# Patient Record
Sex: Female | Born: 1945 | Race: White | Hispanic: No | Smoking: Never smoker
Health system: Southern US, Community
[De-identification: ages and names within clinical notes are randomized; demographics above are authoritative.]

## PROBLEM LIST (undated history)

## (undated) DIAGNOSIS — G510 Bell's palsy: Secondary | ICD-10-CM

## (undated) DIAGNOSIS — E079 Disorder of thyroid, unspecified: Secondary | ICD-10-CM

## (undated) DIAGNOSIS — C4490 Unspecified malignant neoplasm of skin, unspecified: Secondary | ICD-10-CM

## (undated) DIAGNOSIS — I10 Essential (primary) hypertension: Secondary | ICD-10-CM

---

## 2012-02-09 DIAGNOSIS — E785 Hyperlipidemia, unspecified: Secondary | ICD-10-CM | POA: Diagnosis not present

## 2012-02-09 DIAGNOSIS — IMO0002 Reserved for concepts with insufficient information to code with codable children: Secondary | ICD-10-CM | POA: Diagnosis not present

## 2012-02-09 DIAGNOSIS — M810 Age-related osteoporosis without current pathological fracture: Secondary | ICD-10-CM | POA: Diagnosis not present

## 2012-02-09 DIAGNOSIS — I10 Essential (primary) hypertension: Secondary | ICD-10-CM | POA: Diagnosis not present

## 2012-02-09 DIAGNOSIS — E781 Pure hyperglyceridemia: Secondary | ICD-10-CM | POA: Diagnosis not present

## 2012-02-16 DIAGNOSIS — E781 Pure hyperglyceridemia: Secondary | ICD-10-CM | POA: Diagnosis not present

## 2012-02-16 DIAGNOSIS — E785 Hyperlipidemia, unspecified: Secondary | ICD-10-CM | POA: Diagnosis not present

## 2012-02-16 DIAGNOSIS — I10 Essential (primary) hypertension: Secondary | ICD-10-CM | POA: Diagnosis not present

## 2012-02-16 DIAGNOSIS — M810 Age-related osteoporosis without current pathological fracture: Secondary | ICD-10-CM | POA: Diagnosis not present

## 2012-07-31 DIAGNOSIS — H52229 Regular astigmatism, unspecified eye: Secondary | ICD-10-CM | POA: Diagnosis not present

## 2012-07-31 DIAGNOSIS — H04129 Dry eye syndrome of unspecified lacrimal gland: Secondary | ICD-10-CM | POA: Diagnosis not present

## 2012-07-31 DIAGNOSIS — H524 Presbyopia: Secondary | ICD-10-CM | POA: Diagnosis not present

## 2012-07-31 DIAGNOSIS — H5200 Hypermetropia, unspecified eye: Secondary | ICD-10-CM | POA: Diagnosis not present

## 2012-12-31 DIAGNOSIS — IMO0002 Reserved for concepts with insufficient information to code with codable children: Secondary | ICD-10-CM | POA: Diagnosis not present

## 2012-12-31 DIAGNOSIS — I10 Essential (primary) hypertension: Secondary | ICD-10-CM | POA: Diagnosis not present

## 2012-12-31 DIAGNOSIS — E785 Hyperlipidemia, unspecified: Secondary | ICD-10-CM | POA: Diagnosis not present

## 2013-12-25 DIAGNOSIS — I10 Essential (primary) hypertension: Secondary | ICD-10-CM | POA: Diagnosis not present

## 2013-12-25 DIAGNOSIS — E785 Hyperlipidemia, unspecified: Secondary | ICD-10-CM | POA: Diagnosis not present

## 2013-12-25 DIAGNOSIS — IMO0002 Reserved for concepts with insufficient information to code with codable children: Secondary | ICD-10-CM | POA: Diagnosis not present

## 2014-12-01 DIAGNOSIS — Z6820 Body mass index (BMI) 20.0-20.9, adult: Secondary | ICD-10-CM | POA: Diagnosis not present

## 2014-12-01 DIAGNOSIS — I10 Essential (primary) hypertension: Secondary | ICD-10-CM | POA: Diagnosis not present

## 2014-12-01 DIAGNOSIS — E782 Mixed hyperlipidemia: Secondary | ICD-10-CM | POA: Diagnosis not present

## 2014-12-01 DIAGNOSIS — M810 Age-related osteoporosis without current pathological fracture: Secondary | ICD-10-CM | POA: Diagnosis not present

## 2015-07-07 DIAGNOSIS — H52223 Regular astigmatism, bilateral: Secondary | ICD-10-CM | POA: Diagnosis not present

## 2015-07-07 DIAGNOSIS — H02401 Unspecified ptosis of right eyelid: Secondary | ICD-10-CM | POA: Diagnosis not present

## 2015-07-07 DIAGNOSIS — H524 Presbyopia: Secondary | ICD-10-CM | POA: Diagnosis not present

## 2015-07-21 DIAGNOSIS — E782 Mixed hyperlipidemia: Secondary | ICD-10-CM | POA: Diagnosis not present

## 2015-07-21 DIAGNOSIS — I10 Essential (primary) hypertension: Secondary | ICD-10-CM | POA: Diagnosis not present

## 2015-07-21 DIAGNOSIS — Z1389 Encounter for screening for other disorder: Secondary | ICD-10-CM | POA: Diagnosis not present

## 2015-07-21 DIAGNOSIS — Z6820 Body mass index (BMI) 20.0-20.9, adult: Secondary | ICD-10-CM | POA: Diagnosis not present

## 2016-04-12 DIAGNOSIS — Z1389 Encounter for screening for other disorder: Secondary | ICD-10-CM | POA: Diagnosis not present

## 2016-04-12 DIAGNOSIS — I10 Essential (primary) hypertension: Secondary | ICD-10-CM | POA: Diagnosis not present

## 2016-04-12 DIAGNOSIS — E782 Mixed hyperlipidemia: Secondary | ICD-10-CM | POA: Diagnosis not present

## 2016-04-12 DIAGNOSIS — Z6820 Body mass index (BMI) 20.0-20.9, adult: Secondary | ICD-10-CM | POA: Diagnosis not present

## 2016-11-17 DIAGNOSIS — Z6820 Body mass index (BMI) 20.0-20.9, adult: Secondary | ICD-10-CM | POA: Diagnosis not present

## 2016-11-17 DIAGNOSIS — I10 Essential (primary) hypertension: Secondary | ICD-10-CM | POA: Diagnosis not present

## 2016-11-17 DIAGNOSIS — E782 Mixed hyperlipidemia: Secondary | ICD-10-CM | POA: Diagnosis not present

## 2016-11-17 DIAGNOSIS — M810 Age-related osteoporosis without current pathological fracture: Secondary | ICD-10-CM | POA: Diagnosis not present

## 2017-05-06 DIAGNOSIS — Z1211 Encounter for screening for malignant neoplasm of colon: Secondary | ICD-10-CM | POA: Diagnosis not present

## 2017-09-19 DIAGNOSIS — Z6820 Body mass index (BMI) 20.0-20.9, adult: Secondary | ICD-10-CM | POA: Diagnosis not present

## 2017-09-19 DIAGNOSIS — Z1389 Encounter for screening for other disorder: Secondary | ICD-10-CM | POA: Diagnosis not present

## 2017-09-19 DIAGNOSIS — I10 Essential (primary) hypertension: Secondary | ICD-10-CM | POA: Diagnosis not present

## 2017-09-19 DIAGNOSIS — E782 Mixed hyperlipidemia: Secondary | ICD-10-CM | POA: Diagnosis not present

## 2017-11-13 DIAGNOSIS — I10 Essential (primary) hypertension: Secondary | ICD-10-CM | POA: Diagnosis not present

## 2017-11-13 DIAGNOSIS — D696 Thrombocytopenia, unspecified: Secondary | ICD-10-CM | POA: Diagnosis not present

## 2017-11-13 DIAGNOSIS — E039 Hypothyroidism, unspecified: Secondary | ICD-10-CM | POA: Diagnosis not present

## 2018-06-21 DIAGNOSIS — H25813 Combined forms of age-related cataract, bilateral: Secondary | ICD-10-CM | POA: Diagnosis not present

## 2018-06-29 ENCOUNTER — Other Ambulatory Visit (HOSPITAL_COMMUNITY): Payer: Self-pay | Admitting: Family Medicine

## 2018-06-29 DIAGNOSIS — Z1389 Encounter for screening for other disorder: Secondary | ICD-10-CM | POA: Diagnosis not present

## 2018-06-29 DIAGNOSIS — H6123 Impacted cerumen, bilateral: Secondary | ICD-10-CM | POA: Diagnosis not present

## 2018-06-29 DIAGNOSIS — Z6820 Body mass index (BMI) 20.0-20.9, adult: Secondary | ICD-10-CM | POA: Diagnosis not present

## 2018-06-29 DIAGNOSIS — H532 Diplopia: Secondary | ICD-10-CM

## 2018-06-29 DIAGNOSIS — I10 Essential (primary) hypertension: Secondary | ICD-10-CM | POA: Diagnosis not present

## 2018-06-29 DIAGNOSIS — N390 Urinary tract infection, site not specified: Secondary | ICD-10-CM | POA: Diagnosis not present

## 2018-06-29 DIAGNOSIS — Z0001 Encounter for general adult medical examination with abnormal findings: Secondary | ICD-10-CM | POA: Diagnosis not present

## 2018-06-29 DIAGNOSIS — E785 Hyperlipidemia, unspecified: Secondary | ICD-10-CM | POA: Diagnosis not present

## 2018-09-06 ENCOUNTER — Encounter (HOSPITAL_COMMUNITY): Payer: Self-pay | Admitting: Radiology

## 2018-09-06 ENCOUNTER — Ambulatory Visit (HOSPITAL_COMMUNITY)
Admission: RE | Admit: 2018-09-06 | Discharge: 2018-09-06 | Disposition: A | Discharge status: Home / Self Care | Payer: Medicare Other | Source: Ambulatory Visit | Attending: Family Medicine | Admitting: Family Medicine

## 2018-09-06 DIAGNOSIS — H532 Diplopia: Secondary | ICD-10-CM

## 2019-04-19 DIAGNOSIS — N390 Urinary tract infection, site not specified: Secondary | ICD-10-CM | POA: Diagnosis not present

## 2019-04-25 DIAGNOSIS — N390 Urinary tract infection, site not specified: Secondary | ICD-10-CM | POA: Diagnosis not present

## 2019-04-25 DIAGNOSIS — R8279 Other abnormal findings on microbiological examination of urine: Secondary | ICD-10-CM | POA: Diagnosis not present

## 2019-10-03 DIAGNOSIS — X32XXXA Exposure to sunlight, initial encounter: Secondary | ICD-10-CM | POA: Diagnosis not present

## 2019-10-03 DIAGNOSIS — L821 Other seborrheic keratosis: Secondary | ICD-10-CM | POA: Diagnosis not present

## 2019-10-03 DIAGNOSIS — L570 Actinic keratosis: Secondary | ICD-10-CM | POA: Diagnosis not present

## 2019-11-23 ENCOUNTER — Ambulatory Visit
Admission: EM | Admit: 2019-11-23 | Discharge: 2019-11-23 | Disposition: A | Discharge status: Home / Self Care | Payer: Medicare Other

## 2019-11-23 ENCOUNTER — Other Ambulatory Visit: Payer: Self-pay

## 2019-11-23 DIAGNOSIS — M792 Neuralgia and neuritis, unspecified: Secondary | ICD-10-CM | POA: Diagnosis not present

## 2019-11-23 HISTORY — DX: Bell's palsy: G51.0

## 2019-11-23 HISTORY — DX: Essential (primary) hypertension: I10

## 2019-11-23 HISTORY — DX: Unspecified malignant neoplasm of skin, unspecified: C44.90

## 2019-11-23 HISTORY — DX: Disorder of thyroid, unspecified: E07.9

## 2019-11-23 MED ORDER — ACETAMINOPHEN 325 MG PO TABS: 650.0000 mg | ORAL_TABLET | Freq: Four times a day (QID) | ORAL | 0 refills | Status: AC | PRN

## 2019-11-23 NOTE — ED Triage Notes (Addendum)
Patient states that she has right side temple headache x 3 days, some eye pressure. some sinus congestion. 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

## 2019-11-23 NOTE — ED Provider Notes (Signed)
[facility]    CSN: [id number] Arrival date & time: 11/23/19  0844      History   Chief Complaint Chief Complaint  Patient presents with  . Headache    HPI Chelsea Harris is a 74 y.o. female.   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  Symptoms associated with sinus congestion and eye pressure.  Localizes pain to to her scalp at right temporal.  Has tried OTC analgesics without relief.  Symptoms resolved at this time.  Denies similar symptoms in the past.  Denies fever, chills, dysphagia, confusion, diplopia, blurry vision,  neck swelling, nausea, vomiting, chest pain, SOB.    The history is provided by the patient. No language interpreter was used.  Headache   Past Medical History:  Diagnosis Date  . Bell's palsy   . Hypertension   . Skin cancer   . Thyroid disease     There are no problems to display for this patient.   History reviewed. No pertinent surgical history.  OB History   No obstetric history on file.      Home Medications    Prior to Admission medications   Medication Sig Start Date End Date Taking? Authorizing Provider  amLODipine (NORVASC) 10 MG tablet Take 10 mg by mouth daily.   Yes [provider]  levothyroxine (SYNTHROID) 50 MCG tablet Take 50 mcg by mouth daily before breakfast.   Yes [provider]  acetaminophen (TYLENOL) 325 MG tablet Take 2 tablets (650 mg total) by mouth every 6 (six) hours as needed. Take with food 11/23/19   Emerson Monte, FNP    Family History Family History  Problem Relation Age of Onset  . Healthy Mother   . Healthy Father     Social History Social History   Tobacco Use  . Smoking status: Never Smoker  . Smokeless tobacco: Never Used  Substance Use Topics  . Alcohol use: Never  . Drug use: Not on file     Allergies   Patient has no  known allergies.   Review of Systems Review of Systems  Constitutional: Negative.   Respiratory: Negative.   Cardiovascular: Negative.   Neurological: Positive for headaches.  All other systems reviewed and are negative.    Physical Exam Triage Vital Signs ED Triage Vitals  Enc Vitals Group     BP 11/23/19 0928 (!) 163/96     Pulse Rate 11/23/19 0928 95     Resp 11/23/19 0928 17     Temp 11/23/19 0928 98 F (36.7 C)     Temp Source 11/23/19 0928 Oral     SpO2 11/23/19 0928 97 %     Weight --      Height --      Head Circumference --      Peak Flow --      Pain Score 11/23/19 0937 8     Pain Loc --      Pain Edu? --      Excl. in [place]? --    No data found.  Updated Vital Signs BP (!) 163/96 (BP Location: Right Arm)   Pulse 95   Temp 98 F (36.7 C) (Oral)   Resp 17   SpO2 97%   Visual Acuity Right Eye Distance:   Left Eye Distance:   Bilateral Distance:    Right Eye Near:  Left Eye Near:    Bilateral Near:     Physical Exam Vitals and nursing note reviewed.  Constitutional:      General: She is not in acute distress.    Appearance: She is well-developed and normal weight. She is not ill-appearing, toxic-appearing or diaphoretic.  Cardiovascular:     Rate and Rhythm: Normal rate and regular rhythm.     Pulses: Normal pulses.     Heart sounds: Normal heart sounds. No murmur. No gallop.   Pulmonary:     Effort: Pulmonary effort is normal. No respiratory distress.     Breath sounds: Normal breath sounds. No stridor. No wheezing, rhonchi or rales.  Chest:     Chest wall: No tenderness.  Neurological:     Mental Status: She is alert and oriented to person, place, and time.     Cranial Nerves: Cranial nerves are intact. No cranial nerve deficit.     Sensory: No sensory deficit.     Motor: Motor function is intact. No weakness.     Coordination: Coordination is intact. Coordination normal.     Gait: Gait is intact.      UC Treatments / Results   Labs (all labs ordered are listed, but only abnormal results are displayed) Labs Reviewed - No data to display  EKG   Radiology No results found.  Procedures Procedures (including critical care time)  Medications Ordered in UC Medications - No data to display  Initial Impression / Assessment and Plan / UC Course  I have reviewed the triage vital signs and the nursing notes.  Pertinent labs & imaging results that were available during my care of the patient were reviewed by me and considered in my medical decision making (see chart for details).    Patient is stable for discharge. Advised patient to take OTC Tylenol/ibuprofen for pain management To follow-up with primary care for possible referral to neurology Return for worsening of symptoms   Final Clinical Impressions(s) / UC Diagnoses   Final diagnoses:  Neuralgia involving scalp     Discharge Instructions     Advised patient to take OTC NSAID Tylenol/ibuprofen Follow-up with primary care for possible referral to neurology Return for worsening of symptoms    ED Prescriptions    Medication Sig Dispense Auth. Provider   acetaminophen (TYLENOL) 325 MG tablet Take 2 tablets (650 mg total) by mouth every 6 (six) hours as needed. Take with food 30 tablet Saory Carriero, Darrelyn Hillock, FNP     PDMP not reviewed this encounter.   Emerson Monte, [place] 11/23/19 1015

## 2019-11-23 NOTE — Discharge Instructions (Signed)
Advised patient to take OTC NSAID Tylenol/ibuprofen Follow-up with primary care for possible referral to neurology Return for worsening of symptoms

## 2019-11-29 DIAGNOSIS — Z6821 Body mass index (BMI) 21.0-21.9, adult: Secondary | ICD-10-CM | POA: Diagnosis not present

## 2019-11-29 DIAGNOSIS — B029 Zoster without complications: Secondary | ICD-10-CM | POA: Diagnosis not present

## 2020-01-28 DIAGNOSIS — N390 Urinary tract infection, site not specified: Secondary | ICD-10-CM | POA: Diagnosis not present

## 2020-01-29 DIAGNOSIS — R300 Dysuria: Secondary | ICD-10-CM | POA: Diagnosis not present

## 2020-02-12 DIAGNOSIS — Z23 Encounter for immunization: Secondary | ICD-10-CM | POA: Diagnosis not present

## 2020-05-20 DIAGNOSIS — U071 COVID-19: Secondary | ICD-10-CM | POA: Diagnosis not present

## 2020-06-25 DIAGNOSIS — H43393 Other vitreous opacities, bilateral: Secondary | ICD-10-CM | POA: Diagnosis not present

## 2020-07-13 DIAGNOSIS — H35363 Drusen (degenerative) of macula, bilateral: Secondary | ICD-10-CM | POA: Diagnosis not present

## 2020-07-13 DIAGNOSIS — H52213 Irregular astigmatism, bilateral: Secondary | ICD-10-CM | POA: Diagnosis not present

## 2020-07-13 DIAGNOSIS — H2511 Age-related nuclear cataract, right eye: Secondary | ICD-10-CM | POA: Diagnosis not present

## 2020-07-13 DIAGNOSIS — H2513 Age-related nuclear cataract, bilateral: Secondary | ICD-10-CM | POA: Diagnosis not present

## 2020-07-13 DIAGNOSIS — H35033 Hypertensive retinopathy, bilateral: Secondary | ICD-10-CM | POA: Diagnosis not present

## 2020-07-13 DIAGNOSIS — H25013 Cortical age-related cataract, bilateral: Secondary | ICD-10-CM | POA: Diagnosis not present

## 2020-07-28 DIAGNOSIS — H25811 Combined forms of age-related cataract, right eye: Secondary | ICD-10-CM | POA: Diagnosis not present

## 2020-07-28 DIAGNOSIS — H2511 Age-related nuclear cataract, right eye: Secondary | ICD-10-CM | POA: Diagnosis not present

## 2020-08-03 DIAGNOSIS — Z961 Presence of intraocular lens: Secondary | ICD-10-CM | POA: Diagnosis not present

## 2020-08-03 DIAGNOSIS — Z9841 Cataract extraction status, right eye: Secondary | ICD-10-CM | POA: Diagnosis not present

## 2020-08-19 DIAGNOSIS — H2512 Age-related nuclear cataract, left eye: Secondary | ICD-10-CM | POA: Diagnosis not present

## 2020-08-19 DIAGNOSIS — H25012 Cortical age-related cataract, left eye: Secondary | ICD-10-CM | POA: Diagnosis not present

## 2020-08-25 DIAGNOSIS — H25012 Cortical age-related cataract, left eye: Secondary | ICD-10-CM | POA: Diagnosis not present

## 2020-08-25 DIAGNOSIS — H2512 Age-related nuclear cataract, left eye: Secondary | ICD-10-CM | POA: Diagnosis not present

## 2020-08-25 DIAGNOSIS — H25812 Combined forms of age-related cataract, left eye: Secondary | ICD-10-CM | POA: Diagnosis not present

## 2020-09-17 NOTE — Progress Notes (Addendum)
[facility] - Clinic Note  09/25/2020     CHIEF COMPLAINT Patient presents for Retina Evaluation   HISTORY OF PRESENT ILLNESS: Chelsea Harris is a 75 y.o. female who presents to the clinic today for:   HPI    Retina Evaluation    In left eye.  Context:  distance vision and near vision.          Comments    Pt states her vision is improved, following cataract surgery recently.  Pt does not know dates of surgery.  Right eye CEIOL was first.  Surgeon--Dr. Elmer Picker.  Pt denies eye pain or discomfort and denies any new or worsening floaters or fol OU.       Last edited by Corrinne Eagle on 09/25/2020  8:51 AM. (History)    pt is here on the referral of Dr. Earlene Plater for concern of nevus OS, pt states Dr. Elmer Picker did cataract sx and noticed the nevus, she states Dr. Earlene Plater read Dr. Deirdre Evener notes and wanted to refer her out for the nevus, pt is unsure when her cataract sx were, but says she just finished the post op drops  Referring physician: Desiree Lucy, OD [address] [place],  [place] [address]  HISTORICAL INFORMATION:   Selected notes from the MEDICAL RECORD NUMBER Referred by Dr. Desiree Lucy for concern of nevus LEE:  Ocular Hx- PMH-    CURRENT MEDICATIONS: No current outpatient medications on file. (Ophthalmic Drugs)   No current facility-administered medications for this visit. (Ophthalmic Drugs)   Current Outpatient Medications (Other)  Medication Sig  . acetaminophen (TYLENOL) 325 MG tablet Take 2 tablets (650 mg total) by mouth every 6 (six) hours as needed. Take with food  . amLODipine (NORVASC) 10 MG tablet Take 10 mg by mouth daily.  Marland Kitchen levothyroxine (SYNTHROID) 50 MCG tablet Take 50 mcg by mouth daily before breakfast.   No current facility-administered medications for this visit. (Other)      REVIEW OF SYSTEMS: ROS    Positive for: Eyes   Negative for: Constitutional, Gastrointestinal, Neurological, Skin, Genitourinary,  Musculoskeletal, HENT, Endocrine, Cardiovascular, Respiratory, Psychiatric, Allergic/Imm, Heme/Lymph   Last edited by Corrinne Eagle on 09/25/2020  8:37 AM. (History)       ALLERGIES No Known Allergies  PAST MEDICAL HISTORY Past Medical History:  Diagnosis Date  . Bell's palsy   . Hypertension   . Skin cancer   . Thyroid disease    History reviewed. No pertinent surgical history.  FAMILY HISTORY Family History  Problem Relation Age of Onset  . Healthy Mother   . Healthy Father     SOCIAL HISTORY Social History   Tobacco Use  . Smoking status: Never Smoker  . Smokeless tobacco: Never Used  Substance Use Topics  . Alcohol use: Never         OPHTHALMIC EXAM:  Base Eye Exam    Visual Acuity (Snellen - Linear)      Right Left   Dist [place] 20/30 -2 20/30 -2   Dist ph [place] 20/20 -2 20/25 +2       Tonometry (Tonopen, 8:49 AM)      Right Left   Pressure 12 13       Pupils      Dark Light Shape React APD   Right 3 2 Round Brisk 0   Left 3 2 Round Brisk 0       Visual Fields  Left Right    Full Full       Extraocular Movement      Right Left    Full Full       Neuro/Psych    Oriented x3: Yes   Mood/Affect: Normal       Dilation    Both eyes: 1.0% Mydriacyl, 2.5% Phenylephrine @ 8:49 AM        Slit Lamp and Fundus Exam    Slit Lamp Exam      Right Left   Lids/Lashes Dermatochalasis - upper lid Dermatochalasis - upper lid   Conjunctiva/Sclera White and quiet White and quiet   Cornea 1-2+ inferior Punctate epithelial erosions, well healed cataract wounds Trace inferior Punctate epithelial erosions, well healed cataract wounds, mild arcus   Anterior Chamber Deep and quiet Deep and quiet   Iris Round and dilated Round and dilated   Lens PC IOL in good position PC IOL in good position   Vitreous Vitreous syneresis Vitreous syneresis       Fundus Exam      Right Left   Disc Pink and Sharp, mild tilt, temporal PPA Pink and Sharp, mild tilt,  mild temporal PPA, Compact   C/D Ratio 0.5 0.3   Macula Flat, Blunted foveal reflex, fine Drusen temporal macula, Pigment clumping, No heme or edema Flat, Blunted foveal reflex, fine Drusen temporal macula, Pigment clumping, No heme or edema   Vessels mild attenuation, mild tortuousity, AV crossing changes mild attenuation, mild tortuousity, AV crossing changes, mild dilation of ST venule   Periphery Attached, no heme    Attached, pigmented choroidal nevus at 0100 with overlying drusen, no orange pigment or SRF approx 3x4-5DD -- less than 63mm in thickness, No heme         Refraction    Manifest Refraction      Sphere Cylinder Axis Dist VA   Right -1.50 +0.75 180 20/20-1   Left -1.75 +1.75 175 20/20-2          IMAGING AND PROCEDURES  Imaging and Procedures for 09/25/2020  OCT, Retina - OU - Both Eyes       Right Eye Quality was good. Central Foveal Thickness: 261. Progression has no prior data. Findings include normal foveal contour, no IRF, no SRF.   Left Eye Quality was good. Central Foveal Thickness: 263. Progression has no prior data. Findings include normal foveal contour, no IRF, no SRF (Focal elevated hyper reflective choroidal lesion superior periphery caught on widefield).   Notes *Images captured and stored on drive  Diagnosis / Impression:  NFP, no IRF/SRF OU OS: Focal elevated hyper reflective choroidal lesion superior periphery caught on widefield -- choroidal nevus  Clinical management:  See below  Abbreviations: NFP - Normal foveal profile. CME - cystoid macular edema. PED - pigment epithelial detachment. IRF - intraretinal fluid. SRF - subretinal fluid. EZ - ellipsoid zone. ERM - epiretinal membrane. ORA - outer retinal atrophy. ORT - outer retinal tubulation. SRHM - subretinal hyper-reflective material. IRHM - intraretinal hyper-reflective material        Color Fundus Photography Optos - OU - Both Eyes       Right Eye Progression has no prior data.  Disc findings include (tilted). Macula : flat, drusen. Vessels : tortuous vessels. Periphery : RPE abnormality.   Left Eye Progression has no prior data. Disc findings include normal observations. Macula : drusen. Vessels : tortuous vessels (Focal dilation on ST venule). Periphery : RPE abnormality (Focal choroidal nevus at 0130 -- +  drusen, no SRF or orange pigment).   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 Retinal edema  H35.81 OCT, Retina - OU - Both Eyes  3. Essential hypertension  I10   4. Hypertensive retinopathy of both eyes  H35.033   5. Pseudophakia, both eyes  Z96.1     1. Choroidal Nevus OS  - mildly elevated pigmented lesion at 0100  - no visual symptoms, SRF or orange pigment  - +drusen  - thickness < 58mm  - OCT and Optos color images obtained today (01.14.21) to establish baseline  - discussed findings, prognosis  - recommend monitoring  - f/u 4 mos, sooner prn -- DFE/OCT, Optos color images  2. No retinal edema on exam or OCT  3,4. Hypertensive retinopathy OU - discussed importance of tight BP control - monitor  5. Pseudophakia OU  - s/p CE/IOL OU (Fall/Winter 2021 w/ Dr. Herbert Deaner)  - IOLs in good position, doing well - monitor  Ophthalmic Meds Ordered this visit:  No orders of the defined types were placed in this encounter.     Return in about 4 months (around 01/23/2021) for choroidal nevus OS -- Dilated Exam, OCT, Optos color.  There are no Patient Instructions on file for this visit.   Explained the diagnoses, plan, and follow up with the patient and they expressed understanding.  Patient expressed understanding of the importance of proper follow up care.   This document serves as a record of services personally performed  by Gardiner Sleeper, MD, PhD. It was created on their behalf by San Jetty. Owens Shark, OA an ophthalmic technician. The creation of this record is the provider's dictation and/or activities during the visit.    Electronically signed by: San Jetty. Marguerita Merles 01.06.2022 12:43 PM   Gardiner Sleeper, M.D., Ph.D. Diseases & Surgery of the Retina and Vitreous [facility]  I have reviewed the above documentation for accuracy and completeness, and I agree with the above. Gardiner Sleeper, M.D., Ph.D. 09/25/20 12:43 PM  Abbreviations: M myopia (nearsighted); A astigmatism; H hyperopia (farsighted); P presbyopia; Mrx spectacle prescription;  CTL contact lenses; OD right eye; OS left eye; OU both eyes  XT exotropia; ET esotropia; PEK punctate epithelial keratitis; PEE punctate epithelial erosions; DES dry eye syndrome; MGD meibomian gland dysfunction; ATs artificial tears; PFAT's preservative free artificial tears; [place] nuclear sclerotic cataract; PSC posterior subcapsular cataract; ERM epi-retinal membrane; PVD posterior vitreous detachment; RD retinal detachment; DM diabetes mellitus; DR diabetic retinopathy; NPDR non-proliferative diabetic retinopathy; PDR proliferative diabetic retinopathy; CSME clinically significant macular edema; DME diabetic macular edema; dbh dot blot hemorrhages; CWS cotton wool spot; POAG primary open angle glaucoma; C/D cup-to-disc ratio; HVF humphrey visual field; GVF goldmann visual field; OCT optical coherence tomography; IOP intraocular pressure; BRVO Branch retinal vein occlusion; CRVO central retinal vein occlusion; CRAO central retinal artery occlusion; BRAO branch retinal artery occlusion; RT retinal tear; SB scleral buckle; PPV pars plana vitrectomy; VH Vitreous hemorrhage; PRP panretinal laser photocoagulation; IVK intravitreal kenalog; VMT vitreomacular traction; MH Macular hole;  NVD neovascularization of the disc; NVE neovascularization elsewhere; AREDS age  related eye disease study; ARMD age related macular degeneration;  POAG primary open angle glaucoma; EBMD epithelial/anterior basement membrane dystrophy; ACIOL anterior chamber intraocular lens; IOL intraocular lens; PCIOL posterior chamber intraocular lens; Phaco/IOL phacoemulsification with intraocular lens placement; [place] photorefractive keratectomy; LASIK laser assisted in situ keratomileusis; HTN hypertension; DM diabetes mellitus; COPD chronic obstructive pulmonary disease

## 2020-09-25 ENCOUNTER — Ambulatory Visit (INDEPENDENT_AMBULATORY_CARE_PROVIDER_SITE_OTHER): Payer: Medicare Other | Admitting: Ophthalmology

## 2020-09-25 ENCOUNTER — Encounter (INDEPENDENT_AMBULATORY_CARE_PROVIDER_SITE_OTHER): Payer: Self-pay | Admitting: Ophthalmology

## 2020-09-25 ENCOUNTER — Other Ambulatory Visit: Payer: Self-pay

## 2020-09-25 DIAGNOSIS — I10 Essential (primary) hypertension: Secondary | ICD-10-CM

## 2020-09-25 DIAGNOSIS — H35033 Hypertensive retinopathy, bilateral: Secondary | ICD-10-CM | POA: Diagnosis not present

## 2020-09-25 DIAGNOSIS — D3132 Benign neoplasm of left choroid: Secondary | ICD-10-CM | POA: Diagnosis not present

## 2020-09-25 DIAGNOSIS — Z961 Presence of intraocular lens: Secondary | ICD-10-CM

## 2020-09-25 DIAGNOSIS — H3581 Retinal edema: Secondary | ICD-10-CM | POA: Diagnosis not present

## 2020-10-09 DIAGNOSIS — R32 Unspecified urinary incontinence: Secondary | ICD-10-CM | POA: Diagnosis not present

## 2020-10-09 DIAGNOSIS — M810 Age-related osteoporosis without current pathological fracture: Secondary | ICD-10-CM | POA: Diagnosis not present

## 2020-10-09 DIAGNOSIS — Z1331 Encounter for screening for depression: Secondary | ICD-10-CM | POA: Diagnosis not present

## 2020-10-09 DIAGNOSIS — Z0000 Encounter for general adult medical examination without abnormal findings: Secondary | ICD-10-CM | POA: Diagnosis not present

## 2020-10-09 DIAGNOSIS — Z6820 Body mass index (BMI) 20.0-20.9, adult: Secondary | ICD-10-CM | POA: Diagnosis not present

## 2020-10-09 DIAGNOSIS — I10 Essential (primary) hypertension: Secondary | ICD-10-CM | POA: Diagnosis not present

## 2020-10-09 DIAGNOSIS — E7849 Other hyperlipidemia: Secondary | ICD-10-CM | POA: Diagnosis not present

## 2020-10-09 DIAGNOSIS — Z1389 Encounter for screening for other disorder: Secondary | ICD-10-CM | POA: Diagnosis not present

## 2020-12-29 DIAGNOSIS — Z1231 Encounter for screening mammogram for malignant neoplasm of breast: Secondary | ICD-10-CM | POA: Diagnosis not present

## 2021-01-20 DIAGNOSIS — N6001 Solitary cyst of right breast: Secondary | ICD-10-CM | POA: Diagnosis not present

## 2021-01-20 DIAGNOSIS — N6489 Other specified disorders of breast: Secondary | ICD-10-CM | POA: Diagnosis not present

## 2021-01-20 DIAGNOSIS — R922 Inconclusive mammogram: Secondary | ICD-10-CM | POA: Diagnosis not present

## 2021-01-20 DIAGNOSIS — R928 Other abnormal and inconclusive findings on diagnostic imaging of breast: Secondary | ICD-10-CM | POA: Diagnosis not present

## 2021-01-20 DIAGNOSIS — N6320 Unspecified lump in the left breast, unspecified quadrant: Secondary | ICD-10-CM | POA: Diagnosis not present

## 2021-01-27 NOTE — Progress Notes (Signed)
[facility] Note  02/01/2021     CHIEF COMPLAINT Patient presents for Retina Follow Up   HISTORY OF PRESENT ILLNESS: Chelsea Harris is a 75 y.o. female who presents to the clinic today for:   HPI    Retina Follow Up    Patient presents with  Other.  In left eye.  This started 4 months ago.  I, the attending physician,  performed the HPI with the patient and updated documentation appropriately.          Comments    Pt here for 4 mo retinal follow up [place] Nevus OS. Pt states no vision changes, no ocular discomfort or pain.        Last edited by Bernarda Caffey, MD on 02/02/2021 12:53 PM. (History)    pt states no new vision or health concerns  Referring physician: Leticia Clas, [address] [place],  [place] [address]  HISTORICAL INFORMATION:   Selected notes from the MEDICAL RECORD NUMBER Referred by Dr. Leticia Clas for concern of nevus LEE:  Ocular Hx- PMH-    CURRENT MEDICATIONS: No current outpatient medications on file. (Ophthalmic Drugs)   No current facility-administered medications for this visit. (Ophthalmic Drugs)   Current Outpatient Medications (Other)  Medication Sig  . acetaminophen (TYLENOL) 325 MG tablet Take 2 tablets (650 mg total) by mouth every 6 (six) hours as needed. Take with food  . amLODipine (NORVASC) 10 MG tablet Take 10 mg by mouth daily.  Marland Kitchen levothyroxine (SYNTHROID) 50 MCG tablet Take 50 mcg by mouth daily before breakfast.   No current facility-administered medications for this visit. (Other)      REVIEW OF SYSTEMS: ROS    Positive for: Eyes   Negative for: Constitutional, Gastrointestinal, Neurological, Skin, Genitourinary, Musculoskeletal, HENT, Endocrine, Cardiovascular, Respiratory, Psychiatric, Allergic/Imm, Heme/Lymph   Last edited by Kingsley Spittle, COT on 02/01/2021  9:08 AM. (History)       ALLERGIES No Known Allergies  PAST MEDICAL HISTORY Past Medical History:   Diagnosis Date  . Bell's palsy   . Hypertension   . Skin cancer   . Thyroid disease    History reviewed. No pertinent surgical history.  FAMILY HISTORY Family History  Problem Relation Age of Onset  . Healthy Mother   . Healthy Father     SOCIAL HISTORY Social History   Tobacco Use  . Smoking status: Never Smoker  . Smokeless tobacco: Never Used  Substance Use Topics  . Alcohol use: Never         OPHTHALMIC EXAM:  Base Eye Exam    Visual Acuity (Snellen - Linear)      Right Left   Dist [place] 20/40 20/30 +1   Dist ph [place] 20/25 +2 20/20 -1       Tonometry (Tonopen, 9:14 AM)      Right Left   Pressure 10 12       Pupils      Dark Light Shape React APD   Right 3 2 Round Brisk None   Left 3 2 Round Brisk None       Visual Fields (Counting fingers)      Left Right    Full Full       Extraocular Movement      Right Left    Full, Ortho Full, Ortho       Neuro/Psych    Oriented x3: Yes   Mood/Affect: Normal  Dilation    Both eyes: 1.0% Mydriacyl, 2.5% Phenylephrine @ 9:15 AM        Slit Lamp and Fundus Exam    Slit Lamp Exam      Right Left   Lids/Lashes Dermatochalasis - upper lid Dermatochalasis - upper lid   Conjunctiva/Sclera White and quiet White and quiet   Cornea 1-2+ inferior Punctate epithelial erosions, well healed cataract wounds Trace inferior Punctate epithelial erosions, well healed cataract wounds, mild arcus   Anterior Chamber Deep and quiet Deep and quiet   Iris Round and dilated Round and dilated   Lens PC IOL in good position PC IOL in good position   Vitreous Vitreous syneresis Vitreous syneresis       Fundus Exam      Right Left   Disc Pink and Sharp, mild tilt, temporal PPA Pink and Sharp, mild tilt, mild temporal PPA, Compact   C/D Ratio 0.5 0.3   Macula Flat, Blunted foveal reflex, fine Drusen temporal macula, Pigment clumping, No heme or edema Flat, Blunted foveal reflex, fine Drusen temporal macula, Pigment  clumping, No heme or edema   Vessels mild attenuation, mild tortuousity, AV crossing changes mild attenuation, mild tortuousity, AV crossing changes, mild dilation of ST venule   Periphery Attached, no heme    Attached, pigmented choroidal nevus at 0100 with overlying drusen, no orange pigment or SRF approx 3x4-5DD -- less than 66mm in thickness, No heme           IMAGING AND PROCEDURES  Imaging and Procedures for 02/01/2021  OCT, Retina - OU - Both Eyes       Right Eye Quality was good. Central Foveal Thickness: 263. Progression has been stable. Findings include normal foveal contour, no IRF, no SRF.   Left Eye Quality was good. Central Foveal Thickness: 304. Progression has been stable. Findings include normal foveal contour, no SRF, intraretinal fluid (Focal elevated hyper reflective choroidal lesion superior periphery caught on widefield, +cystic changes temporal fovea).   Notes *Images captured and stored on drive  Diagnosis / Impression:  NFP, no IRF/SRF OU OS: Focal elevated hyper reflective choroidal lesion superior periphery caught on widefield -- choroidal nevus, +cystic changes temporal fovea   Clinical management:  See below  Abbreviations: NFP - Normal foveal profile. CME - cystoid macular edema. PED - pigment epithelial detachment. IRF - intraretinal fluid. SRF - subretinal fluid. EZ - ellipsoid zone. ERM - epiretinal membrane. ORA - outer retinal atrophy. ORT - outer retinal tubulation. SRHM - subretinal hyper-reflective material. IRHM - intraretinal hyper-reflective material        Color Fundus Photography Optos - OU - Both Eyes       Right Eye Progression has no prior data. Disc findings include (tilted). Macula : flat, drusen. Vessels : tortuous vessels. Periphery : RPE abnormality.   Left Eye Progression has no prior data. Disc findings include normal observations. Macula : drusen. Vessels : tortuous vessels (Focal dilation on ST venule). Periphery : RPE  abnormality (Focal choroidal nevus at 0130 -- +drusen, no SRF or orange pigment).   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 Retinal edema  H35.81 OCT, Retina - OU -  Both Eyes  3. Essential hypertension  I10   4. Hypertensive retinopathy of both eyes  H35.033   5. Pseudophakia, both eyes  Z96.1     1. Choroidal Nevus OS  - mildly elevated pigmented lesion at 0100  - no visual symptoms, SRF or orange pigment  - +drusen  - thickness < 22mm  - OCT and Optos color images obtained (01.14.21) to establish baseline -- unchanged on today's repeat images and exam  - discussed findings, prognosis  - recommend monitoring  - f/u 9-12 mos, sooner prn -- DFE/OCT, Optos color images  2. No retinal edema on exam or OCT  3,4. Hypertensive retinopathy OU - discussed importance of tight BP control - monitor  5. Pseudophakia OU  - s/p CE/IOL OU (Fall/Winter 2021 w/ Dr. Herbert Deaner)  - IOLs in good position, doing well - monitor  Ophthalmic Meds Ordered this visit:  No orders of the defined types were placed in this encounter.     Return for f/u 9-12 months, choroidal nevus OS, DFE, OCT.  There are no Patient Instructions on file for this visit.  This document serves as a record of services personally performed by Gardiner Sleeper, MD, PhD. It was created on their behalf by Leeann Must, [place], an ophthalmic technician. The creation of this record is the provider's dictation and/or activities during the visit.    Electronically signed by: Leeann Must, COA @TODAY @ 12:57 PM   This document serves as a record of services personally performed by Gardiner Sleeper, MD, PhD. It was created on their behalf by San Jetty. Owens Shark, OA an ophthalmic technician. The creation of this  record is the provider's dictation and/or activities during the visit.    Electronically signed by: San Jetty. Marguerita Merles 05.23.2022 12:57 PM  Gardiner Sleeper, M.D., Ph.D. Diseases & Surgery of the Retina and [place] 02/01/2021   I have reviewed the above documentation for accuracy and completeness, and I agree with the above. Gardiner Sleeper, M.D., Ph.D. 02/02/21 12:57 PM   Abbreviations: M myopia (nearsighted); A astigmatism; H hyperopia (farsighted); P presbyopia; Mrx spectacle prescription;  CTL contact lenses; OD right eye; OS left eye; OU both eyes  XT exotropia; ET esotropia; PEK punctate epithelial keratitis; PEE punctate epithelial erosions; DES dry eye syndrome; MGD meibomian gland dysfunction; ATs artificial tears; PFAT's preservative free artificial tears; [place] nuclear sclerotic cataract; PSC posterior subcapsular cataract; ERM epi-retinal membrane; PVD posterior vitreous detachment; RD retinal detachment; DM diabetes mellitus; DR diabetic retinopathy; NPDR non-proliferative diabetic retinopathy; PDR proliferative diabetic retinopathy; CSME clinically significant macular edema; DME diabetic macular edema; dbh dot blot hemorrhages; CWS cotton wool spot; POAG primary open angle glaucoma; C/D cup-to-disc ratio; HVF humphrey visual field; GVF goldmann visual field; OCT optical coherence tomography; IOP intraocular pressure; BRVO Branch retinal vein occlusion; CRVO central retinal vein occlusion; CRAO central retinal artery occlusion; BRAO branch retinal artery occlusion; RT retinal tear; SB scleral buckle; PPV pars plana vitrectomy; VH Vitreous hemorrhage; PRP panretinal laser photocoagulation; IVK intravitreal kenalog; VMT vitreomacular traction; MH Macular hole;  NVD neovascularization of the disc; NVE neovascularization elsewhere; AREDS age related eye disease study; ARMD age related macular degeneration; POAG primary open angle glaucoma; EBMD  epithelial/anterior basement membrane dystrophy; ACIOL anterior chamber intraocular lens; IOL intraocular lens; PCIOL posterior chamber intraocular lens; Phaco/IOL phacoemulsification with intraocular lens placement; [place] photorefractive keratectomy; LASIK laser assisted in situ keratomileusis; HTN hypertension; DM diabetes mellitus; COPD chronic obstructive pulmonary disease

## 2021-02-01 ENCOUNTER — Ambulatory Visit (INDEPENDENT_AMBULATORY_CARE_PROVIDER_SITE_OTHER): Payer: Medicare Other | Admitting: Ophthalmology

## 2021-02-01 ENCOUNTER — Encounter (INDEPENDENT_AMBULATORY_CARE_PROVIDER_SITE_OTHER): Payer: Self-pay | Admitting: Ophthalmology

## 2021-02-01 ENCOUNTER — Other Ambulatory Visit: Payer: Self-pay

## 2021-02-01 DIAGNOSIS — H3581 Retinal edema: Secondary | ICD-10-CM

## 2021-02-01 DIAGNOSIS — I10 Essential (primary) hypertension: Secondary | ICD-10-CM

## 2021-02-01 DIAGNOSIS — Z961 Presence of intraocular lens: Secondary | ICD-10-CM

## 2021-02-01 DIAGNOSIS — H35033 Hypertensive retinopathy, bilateral: Secondary | ICD-10-CM | POA: Diagnosis not present

## 2021-02-01 DIAGNOSIS — D3132 Benign neoplasm of left choroid: Secondary | ICD-10-CM

## 2021-02-02 ENCOUNTER — Encounter (INDEPENDENT_AMBULATORY_CARE_PROVIDER_SITE_OTHER): Payer: Self-pay | Admitting: Ophthalmology

## 2021-06-17 DIAGNOSIS — X32XXXD Exposure to sunlight, subsequent encounter: Secondary | ICD-10-CM | POA: Diagnosis not present

## 2021-06-17 DIAGNOSIS — L570 Actinic keratosis: Secondary | ICD-10-CM | POA: Diagnosis not present

## 2021-06-17 DIAGNOSIS — L820 Inflamed seborrheic keratosis: Secondary | ICD-10-CM | POA: Diagnosis not present

## 2021-07-22 DIAGNOSIS — L820 Inflamed seborrheic keratosis: Secondary | ICD-10-CM | POA: Diagnosis not present

## 2021-09-30 DIAGNOSIS — M810 Age-related osteoporosis without current pathological fracture: Secondary | ICD-10-CM | POA: Diagnosis not present

## 2021-09-30 DIAGNOSIS — Z6820 Body mass index (BMI) 20.0-20.9, adult: Secondary | ICD-10-CM | POA: Diagnosis not present

## 2021-09-30 DIAGNOSIS — R32 Unspecified urinary incontinence: Secondary | ICD-10-CM | POA: Diagnosis not present

## 2021-09-30 DIAGNOSIS — E7849 Other hyperlipidemia: Secondary | ICD-10-CM | POA: Diagnosis not present

## 2021-09-30 DIAGNOSIS — I10 Essential (primary) hypertension: Secondary | ICD-10-CM | POA: Diagnosis not present

## 2021-09-30 DIAGNOSIS — E782 Mixed hyperlipidemia: Secondary | ICD-10-CM | POA: Diagnosis not present

## 2021-09-30 DIAGNOSIS — R928 Other abnormal and inconclusive findings on diagnostic imaging of breast: Secondary | ICD-10-CM | POA: Diagnosis not present

## 2021-10-04 ENCOUNTER — Other Ambulatory Visit (HOSPITAL_COMMUNITY): Payer: Self-pay | Admitting: Family Medicine

## 2021-10-04 DIAGNOSIS — R928 Other abnormal and inconclusive findings on diagnostic imaging of breast: Secondary | ICD-10-CM

## 2021-10-05 ENCOUNTER — Ambulatory Visit (HOSPITAL_COMMUNITY)
Admission: RE | Admit: 2021-10-05 | Discharge: 2021-10-05 | Disposition: A | Discharge status: Home / Self Care | Payer: Medicare Other | Source: Ambulatory Visit | Attending: Family Medicine | Admitting: Family Medicine

## 2021-10-05 ENCOUNTER — Other Ambulatory Visit: Payer: Self-pay

## 2021-10-05 DIAGNOSIS — R922 Inconclusive mammogram: Secondary | ICD-10-CM | POA: Diagnosis not present

## 2021-10-05 DIAGNOSIS — R928 Other abnormal and inconclusive findings on diagnostic imaging of breast: Secondary | ICD-10-CM

## 2022-01-21 NOTE — Progress Notes (Shared)
[facility] Note  02/01/2022     CHIEF COMPLAINT Patient presents for Retina Follow Up   HISTORY OF PRESENT ILLNESS: Chelsea Harris is a 76 y.o. female who presents to the clinic today for:   HPI     Retina Follow Up   Patient presents with  Other.  In left eye.  This started 4 months ago.  I, the attending physician,  performed the HPI with the patient and updated documentation appropriately.        Comments   Patient here for 12 months retina follow up for choroidal nevus OS. Patient states vision doing fine. Has allergies bad this year. Eyes are watery and gummed up. Scratchy. No eye pain.      Last edited by Bernarda Caffey, MD on 02/03/2022  1:12 PM.    pt states no change in vision  Referring physician: Leticia Clas, [address] [place],  [place] [address]  HISTORICAL INFORMATION:   Selected notes from the MEDICAL RECORD NUMBER Referred by Dr. Leticia Clas for concern of nevus LEE:  Ocular Hx- PMH-    CURRENT MEDICATIONS: No current outpatient medications on file. (Ophthalmic Drugs)   No current facility-administered medications for this visit. (Ophthalmic Drugs)   Current Outpatient Medications (Other)  Medication Sig   acetaminophen (TYLENOL) 325 MG tablet Take 2 tablets (650 mg total) by mouth every 6 (six) hours as needed. Take with food   amLODipine (NORVASC) 10 MG tablet Take 10 mg by mouth daily.   levothyroxine (SYNTHROID) 50 MCG tablet Take 50 mcg by mouth daily before breakfast.   No current facility-administered medications for this visit. (Other)   REVIEW OF SYSTEMS: ROS   Positive for: Eyes Negative for: Constitutional, Gastrointestinal, Neurological, Skin, Genitourinary, Musculoskeletal, HENT, Endocrine, Cardiovascular, Respiratory, Psychiatric, Allergic/Imm, Heme/Lymph Last edited by Theodore Demark, COA on 02/01/2022  9:53 AM.     ALLERGIES No Known Allergies  PAST MEDICAL HISTORY Past  Medical History:  Diagnosis Date   Bell's palsy    Hypertension    Skin cancer    Thyroid disease    History reviewed. No pertinent surgical history.  FAMILY HISTORY Family History  Problem Relation Age of Onset   Healthy Mother    Healthy Father    SOCIAL HISTORY Social History   Tobacco Use   Smoking status: Never   Smokeless tobacco: Never  Vaping Use   Vaping Use: Never used  Substance Use Topics   Alcohol use: Never       OPHTHALMIC EXAM:  Base Eye Exam     Visual Acuity (Snellen - Linear)       Right Left   Dist [place] 20/40 20/25   Dist ph [place] 20/25 -1 20/20         Tonometry (Tonopen, 9:50 AM)       Right Left   Pressure 12 10         Pupils       Dark Light Shape React APD   Right 3 2 Round Brisk None   Left 3 2 Round Brisk None         Visual Fields (Counting fingers)       Left Right    Full Full         Extraocular Movement       Right Left    Full, Ortho Full, Ortho         Neuro/Psych  Oriented x3: Yes   Mood/Affect: Normal         Dilation     Both eyes: 1.0% Mydriacyl, 2.5% Phenylephrine @ 9:50 AM           Slit Lamp and Fundus Exam     Slit Lamp Exam       Right Left   Lids/Lashes Dermatochalasis - upper lid, Meibomian gland dysfunction Dermatochalasis - upper lid, Meibomian gland dysfunction   Conjunctiva/Sclera White and quiet White and quiet   Cornea 3+ fine Punctate epithelial erosions, well healed cataract wounds, mild arcus 1-2+inferior Punctate epithelial erosions, well healed cataract wounds, mild arcus, +EBMD   Anterior Chamber Deep and quiet Deep and quiet   Iris Round and dilated Round and dilated   Lens PC IOL in good position PC IOL in good position, 1-2+ Posterior capsular opacification   Anterior Vitreous Vitreous syneresis Vitreous syneresis         Fundus Exam       Right Left   Disc Pink and Sharp, mild tilt, temporal PPA Pink and Sharp, mild tilt, mild temporal PPA, Compact    C/D Ratio 0.4 0.3   Macula Flat, Blunted foveal reflex, fine Drusen temporal macula, Pigment clumping, No heme or edema Flat, Blunted foveal reflex, fine Drusen temporal macula, Pigment clumping, No heme or edema   Vessels mild attenuation, mild tortuosity mild attenuation, mild tortuosity   Periphery Attached, no heme    Attached, pigmented choroidal nevus at 0100 with overlying drusen, no orange pigment or SRF approx 3x4-5DD -- less than 95m in thickness -- stable from prior, No heme           IMAGING AND PROCEDURES  Imaging and Procedures for 02/01/2022  OCT, Retina - OU - Both Eyes       Right Eye Quality was good. Central Foveal Thickness: 255. Progression has been stable. Findings include normal foveal contour, no IRF, no SRF.   Left Eye Quality was good. Central Foveal Thickness: 260. Progression has improved. Findings include normal foveal contour, no SRF, intraretinal fluid (Focal elevated hyper reflective choroidal lesion superior periphery caught on widefield -- stable, interval improvement in cystic changes temporal fovea - resolved).   Notes *Images captured and stored on drive  Diagnosis / Impression:  NFP, no IRF/SRF OU OS: Focal elevated hyper reflective choroidal lesion superior periphery caught on widefield -- stable, interval improvement in cystic changes temporal fovea - resolved   Clinical management:  See below  Abbreviations: NFP - Normal foveal profile. CME - cystoid macular edema. PED - pigment epithelial detachment. IRF - intraretinal fluid. SRF - subretinal fluid. EZ - ellipsoid zone. ERM - epiretinal membrane. ORA - outer retinal atrophy. ORT - outer retinal tubulation. SRHM - subretinal hyper-reflective material. IRHM - intraretinal hyper-reflective material      Color Fundus Photography Optos - OU - Both Eyes       Right Eye Progression has been stable. Disc findings include (tilted). Macula : flat, drusen. Vessels : tortuous vessels.  Periphery : RPE abnormality.   Left Eye Progression has been stable. Disc findings include normal observations. Macula : drusen. Vessels : tortuous vessels (Focal dilation on ST venule). Periphery : RPE abnormality (Focal choroidal nevus at 0130 -- +drusen, no SRF or orange pigment).   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 Essential hypertension  I10 OCT, Retina - OU - Both Eyes    3. Hypertensive retinopathy of both eyes  H35.033 OCT, Retina - OU - Both Eyes    4. Pseudophakia, both eyes  Z96.1      1. Choroidal Nevus OS  - mildly elevated pigmented lesion at 0100  - no visual symptoms, SRF or orange pigment  - +drusen  - thickness < 5m  - OCT and Optos color images obtained (01.14.21) to establish baseline -- unchanged on today's repeat images and exam  - discussed findings, prognosis  - recommend monitoring  - f/u 12 mos, sooner prn -- DFE/OCT, Optos color images  2,3. Hypertensive retinopathy OU - discussed importance of tight BP control - monitor  4. Pseudophakia OU  - s/p CE/IOL OU (Fall/Winter 2021 w/ Dr. HHerbert Deaner  - IOLs in good position, doing well - monitor  Ophthalmic Meds Ordered this visit:  No orders of the defined types were placed in this encounter.    Return in about 1 year (around 02/02/2023) for f/u choroidal nevus OS, DFE, OCT.  There are no Patient Instructions on file for this visit.  This document serves as a record of services personally performed by BGardiner Sleeper MD, PhD. It was created on their behalf by BBernarda Caffey MD, an ophthalmic technician. The creation of this record is the provider's dictation and/or activities during the visit.    Electronically signed by: BBernarda Caffey MD 01/21/22 1:16 PM   BGardiner Sleeper M.D., Ph.D. Diseases & Surgery of the Retina and Vitreous [facility] I have reviewed the above documentation for accuracy and completeness, and I agree with the above. BGardiner Sleeper M.D., Ph.D. 02/03/22 1:16 PM  Abbreviations: M myopia (nearsighted); A astigmatism; H hyperopia (farsighted); P presbyopia; Mrx spectacle prescription;  CTL contact lenses; OD right eye; OS left eye; OU both eyes  XT exotropia; ET esotropia; PEK punctate epithelial keratitis; PEE punctate epithelial erosions; DES dry eye syndrome; MGD meibomian gland dysfunction; ATs artificial tears; PFAT's preservative free artificial tears; [place] sclerotic cataract; PSC posterior subcapsular cataract; ERM epi-retinal membrane; PVD posterior vitreous detachment; RD retinal detachment; DM diabetes mellitus; DR diabetic retinopathy; NPDR non-proliferative diabetic retinopathy; PDR proliferative diabetic retinopathy; CSME clinically significant macular edema; DME diabetic macular edema; dbh dot blot hemorrhages; CWS cotton wool spot; POAG primary open angle glaucoma; C/D cup-to-disc ratio; HVF humphrey visual field; GVF goldmann visual field; OCT optical coherence tomography; IOP intraocular pressure; BRVO Branch retinal vein occlusion; CRVO central retinal vein occlusion; CRAO central retinal artery occlusion; BRAO branch retinal artery occlusion; RT retinal tear; SB scleral buckle; PPV pars plana vitrectomy; VH Vitreous hemorrhage; PRP panretinal laser photocoagulation; IVK intravitreal kenalog; VMT vitreomacular traction; MH Macular hole;  NVD neovascularization of the disc; NVE neovascularization elsewhere; AREDS age related eye disease study; ARMD age related macular degeneration; POAG primary open angle glaucoma; EBMD epithelial/anterior basement membrane dystrophy; ACIOL anterior chamber intraocular lens; IOL intraocular lens; PCIOL posterior chamber intraocular lens; Phaco/IOL phacoemulsification  with intraocular lens placement; P[place]iaphotorefractive keratectomy; LASIK laser assisted in situ keratomileusis; HTN hypertension; DM diabetes mellitus; COPD chronic obstructive pulmonary disease

## 2022-02-01 ENCOUNTER — Ambulatory Visit (INDEPENDENT_AMBULATORY_CARE_PROVIDER_SITE_OTHER): Payer: Medicare Other | Admitting: Ophthalmology

## 2022-02-01 ENCOUNTER — Encounter (INDEPENDENT_AMBULATORY_CARE_PROVIDER_SITE_OTHER): Payer: Self-pay | Admitting: Ophthalmology

## 2022-02-01 DIAGNOSIS — Z961 Presence of intraocular lens: Secondary | ICD-10-CM

## 2022-02-01 DIAGNOSIS — D3132 Benign neoplasm of left choroid: Secondary | ICD-10-CM

## 2022-02-01 DIAGNOSIS — H35033 Hypertensive retinopathy, bilateral: Secondary | ICD-10-CM

## 2022-02-01 DIAGNOSIS — I10 Essential (primary) hypertension: Secondary | ICD-10-CM | POA: Diagnosis not present

## 2022-02-03 ENCOUNTER — Encounter (INDEPENDENT_AMBULATORY_CARE_PROVIDER_SITE_OTHER): Payer: Self-pay | Admitting: Ophthalmology

## 2022-02-28 DIAGNOSIS — W57XXXA Bitten or stung by nonvenomous insect and other nonvenomous arthropods, initial encounter: Secondary | ICD-10-CM | POA: Diagnosis not present

## 2022-02-28 DIAGNOSIS — Z6820 Body mass index (BMI) 20.0-20.9, adult: Secondary | ICD-10-CM | POA: Diagnosis not present

## 2022-02-28 DIAGNOSIS — S30860A Insect bite (nonvenomous) of lower back and pelvis, initial encounter: Secondary | ICD-10-CM | POA: Diagnosis not present

## 2022-05-24 DIAGNOSIS — Z0001 Encounter for general adult medical examination with abnormal findings: Secondary | ICD-10-CM | POA: Diagnosis not present

## 2022-05-24 DIAGNOSIS — I10 Essential (primary) hypertension: Secondary | ICD-10-CM | POA: Diagnosis not present

## 2022-05-24 DIAGNOSIS — E782 Mixed hyperlipidemia: Secondary | ICD-10-CM | POA: Diagnosis not present

## 2022-05-24 DIAGNOSIS — Z1331 Encounter for screening for depression: Secondary | ICD-10-CM | POA: Diagnosis not present

## 2022-05-24 DIAGNOSIS — Z681 Body mass index (BMI) 19 or less, adult: Secondary | ICD-10-CM | POA: Diagnosis not present

## 2022-06-01 DIAGNOSIS — Z1211 Encounter for screening for malignant neoplasm of colon: Secondary | ICD-10-CM | POA: Diagnosis not present

## 2022-12-01 DIAGNOSIS — H04123 Dry eye syndrome of bilateral lacrimal glands: Secondary | ICD-10-CM | POA: Diagnosis not present

## 2023-01-05 NOTE — Progress Notes (Shared)
[facility] - Clinic Note  01/11/2023     CHIEF COMPLAINT Patient presents for Retina Follow Up   HISTORY OF PRESENT ILLNESS: Chelsea Harris is a 77 y.o. female who presents to the clinic today for:   HPI     Retina Follow Up   Patient presents with  Other.  In left eye.  Severity is moderate.  Duration of 12 months.  Since onset it is stable.  I, the attending physician,  performed the HPI with the patient and updated documentation appropriately.        Comments   12 month Retina eval. Patient states she saw Dr.Davis and was told there is a film on her implant. Unsure of which eye. Patient states vision may not be as good      Last edited by Rennis Chris, MD on 01/12/2023  1:27 PM.    Pt states that Dr. Earlene Plater told her she has a "skim" on her lens implants, she uses Systane 2-3 times a day  Referring physician: Desiree Lucy, OD [address] [place],  [place] [address]  HISTORICAL INFORMATION:   Selected notes from the MEDICAL RECORD NUMBER Referred by Dr. Desiree Lucy for concern of nevus LEE:  Ocular Hx- PMH-    CURRENT MEDICATIONS: No current outpatient medications on file. (Ophthalmic Drugs)   No current facility-administered medications for this visit. (Ophthalmic Drugs)   Current Outpatient Medications (Other)  Medication Sig   acetaminophen (TYLENOL) 325 MG tablet Take 2 tablets (650 mg total) by mouth every 6 (six) hours as needed. Take with food   amLODipine (NORVASC) 10 MG tablet Take 10 mg by mouth daily.   levothyroxine (SYNTHROID) 50 MCG tablet Take 50 mcg by mouth daily before breakfast.   No current facility-administered medications for this visit. (Other)   REVIEW OF SYSTEMS: ROS   Positive for: Endocrine, Eyes Negative for: Constitutional, Gastrointestinal, Neurological, Skin, Genitourinary, Musculoskeletal, HENT, Cardiovascular, Respiratory, Psychiatric, Allergic/Imm, Heme/Lymph Last edited by Lana Fish, COT  on 01/11/2023  9:44 AM.      ALLERGIES No Known Allergies  PAST MEDICAL HISTORY Past Medical History:  Diagnosis Date   Bell's palsy    Hypertension    Skin cancer    Thyroid disease    History reviewed. No pertinent surgical history.  FAMILY HISTORY Family History  Problem Relation Age of Onset   Healthy Mother    Healthy Father    SOCIAL HISTORY Social History   Tobacco Use   Smoking status: Never   Smokeless tobacco: Never  Vaping Use   Vaping Use: Never used  Substance Use Topics   Alcohol use: Never       OPHTHALMIC EXAM:  Base Eye Exam     Visual Acuity (Snellen - Linear)       Right Left   Dist [place] 20/40 20/25   Dist ph [place] 20/25-1 20/25         Tonometry (Tonopen, 9:46 AM)       Right Left   Pressure 13 14         Pupils       Dark Light Shape React APD   Right 3 2 Round Brisk None   Left 3 2 Round Brisk None         Visual Fields (Counting fingers)       Left Right    Full Full         Extraocular Movement  Right Left    Full, Ortho Full, Ortho         Neuro/Psych     Oriented x3: Yes   Mood/Affect: Normal         Dilation     Both eyes: 1.0% Mydriacyl, 2.5% Phenylephrine @ 9:46 AM           Slit Lamp and Fundus Exam     Slit Lamp Exam       Right Left   Lids/Lashes Dermatochalasis - upper lid, mild MGD Dermatochalasis - upper lid, mild MGD   Conjunctiva/Sclera temporal pinguecula temporal pinguecula   Cornea 2-3+ fine Punctate epithelial erosions, well healed cataract wounds, mild arcus Trace Punctate epithelial erosions, well healed cataract wounds, mild arcus, +EBMD   Anterior Chamber deep and clear deep and clear   Iris Round and dilated Round and dilated   Lens PC IOL in good position PC IOL in good position, 1+ Posterior capsular opacification   Anterior Vitreous Vitreous syneresis Vitreous syneresis         Fundus Exam       Right Left   Disc Pink and Sharp, mild tilt, temporal PPA  Pink and Sharp, mild tilt, mild temporal PPA, Compact   C/D Ratio 0.4 0.3   Macula Flat, Blunted foveal reflex, fine Drusen temporal macula, Pigment clumping, No heme or edema Flat, Blunted foveal reflex, fine Drusen temporal macula, Pigment clumping, No heme or edema   Vessels attenuated, Tortuous attenuated, Tortuous   Periphery Attached, no heme Attached, pigmented choroidal nevus at 0100 with overlying drusen, no orange pigment or SRF, approx 3x4-5DD -- less than 2mm in thickness -- stable from prior, No heme           Refraction     Manifest Refraction (Auto)       Sphere Cylinder Axis Dist VA   Right -1.75 +1.00 005 20/20   Left -1.00 +1.25 175 20/20-2           IMAGING AND PROCEDURES  Imaging and Procedures for 01/11/2023  OCT, Retina - OU - Both Eyes       Right Eye Quality was good. Central Foveal Thickness: 258. Progression has been stable. Findings include normal foveal contour, no IRF, no SRF.   Left Eye Quality was good. Central Foveal Thickness: 258. Progression has been stable. Findings include normal foveal contour, no IRF, no SRF (Focal elevated hyper reflective choroidal lesion superior periphery caught on widefield -- not imaged today).   Notes *Images captured and stored on drive  Diagnosis / Impression:  NFP, no IRF/SRF OU OS: Focal elevated hyper reflective choroidal lesion superior periphery caught on widefield -- not imaged today  Clinical management:  See below  Abbreviations: NFP - Normal foveal profile. CME - cystoid macular edema. PED - pigment epithelial detachment. IRF - intraretinal fluid. SRF - subretinal fluid. EZ - ellipsoid zone. ERM - epiretinal membrane. ORA - outer retinal atrophy. ORT - outer retinal tubulation. SRHM - subretinal hyper-reflective material. IRHM - intraretinal hyper-reflective material      Color Fundus Photography Optos - OU - Both Eyes       Right Eye Progression has been stable. Disc findings include  (tilted). Macula : drusen, flat. Vessels : tortuous vessels. Periphery : RPE abnormality.   Left Eye Progression has been stable. Disc findings include normal observations. Macula : drusen. Vessels : tortuous vessels (Focal dilation on ST venule). Periphery : RPE abnormality (Focal choroidal nevus at 0130 -- +drusen, no SRF or  orange pigment).   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 Essential hypertension  I10     3. Hypertensive retinopathy of both eyes  H35.033     4. Pseudophakia, both eyes  Z96.1     5. 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 Choroidal Nevus OS  - mildly elevated pigmented lesion at 0100  - no visual symptoms, SRF or orange pigment  - +drusen  - thickness < 2mm  - OCT and Optos color images obtained (01.14.21) to establish baseline -- unchanged on today's repeat images and exam  - discussed findings, prognosis  - recommend monitoring  - f/u 1 yr, sooner prn -- DFE/OCT, Optos color images  2,3. Hypertensive retinopathy OU - discussed importance of tight BP control - monitor  4. Pseudophakia OU  - s/p CE/IOL OU (Fall/Winter 2021 w/ Dr. Elmer Picker)  - IOLs in good position, doing well - monitor  5. PCO OS - will refer back to Dr. Elmer Picker for Yag Cap OS  Ophthalmic Meds Ordered this visit:  No orders of the defined types were placed in this encounter.    Return in about 1 year (around 01/11/2024) for f/u choroidal nevus OS, DFE, OCT.  There are no Patient Instructions on file for this visit.  This document serves as a record of services personally performed by Karie Chimera, MD, PhD. It was created on their behalf by Rennis Chris, MD, an ophthalmic technician. The creation  of this record is the provider's dictation and/or activities during the visit.    Electronically signed by: Rennis Chris, MD 01/21/22 1:31 PM   This document serves as a record of services personally performed by Karie Chimera, MD, PhD. It was created on their behalf by Glee Arvin. Manson Passey, OA an ophthalmic technician. The creation of this record is the provider's dictation and/or activities during the visit.    Electronically signed by: Glee Arvin. [place], [place] 05.01.2024 1:31 PM  Karie Chimera, M.D., Ph.D. Diseases & Surgery of the Retina and Vitreous [facility]  I have reviewed the above documentation for accuracy and completeness, and I agree with the above. Karie Chimera, M.D., Ph.D. 01/12/23 1:31 PM   Abbreviations: M myopia (nearsighted); A astigmatism; H hyperopia (farsighted); P presbyopia; Mrx spectacle prescription;  CTL contact lenses; OD right eye; OS left eye; OU both eyes  XT exotropia; ET esotropia; PEK punctate epithelial keratitis; PEE punctate epithelial erosions; DES dry eye syndrome; MGD meibomian gland dysfunction; ATs artificial tears; PFAT's preservative free artificial tears; NSC nuclear sclerotic cataract; PSC posterior subcapsular cataract; ERM epi-retinal membrane; PVD posterior vitreous detachment; RD retinal detachment; DM diabetes mellitus; DR diabetic retinopathy; NPDR non-proliferative diabetic retinopathy; PDR proliferative diabetic retinopathy; CSME clinically significant macular edema; DME diabetic macular edema; dbh dot blot hemorrhages; CWS cotton wool spot; POAG primary open angle glaucoma; C/D cup-to-disc ratio; HVF humphrey visual field; GVF goldmann visual field; OCT optical coherence tomography; IOP intraocular pressure; BRVO Branch retinal vein occlusion; CRVO central retinal vein occlusion; CRAO central retinal artery occlusion; BRAO branch retinal artery occlusion; RT  retinal tear; SB scleral buckle; PPV pars plana vitrectomy; VH  Vitreous hemorrhage; PRP panretinal laser photocoagulation; IVK intravitreal kenalog; VMT vitreomacular traction; MH Macular hole;  NVD neovascularization of the disc; NVE neovascularization elsewhere; AREDS age related eye disease study; ARMD age related macular degeneration; POAG primary open angle glaucoma; EBMD epithelial/anterior basement membrane dystrophy; ACIOL anterior chamber intraocular lens; IOL intraocular lens; PCIOL posterior chamber intraocular lens; Phaco/IOL phacoemulsification with intraocular lens placement; PRK photorefractive keratectomy; LASIK laser assisted in situ keratomileusis; HTN hypertension; DM diabetes mellitus; COPD chronic obstructive pulmonary disease

## 2023-01-11 ENCOUNTER — Ambulatory Visit (INDEPENDENT_AMBULATORY_CARE_PROVIDER_SITE_OTHER): Payer: Medicare Other | Admitting: Ophthalmology

## 2023-01-11 ENCOUNTER — Encounter (INDEPENDENT_AMBULATORY_CARE_PROVIDER_SITE_OTHER): Payer: Self-pay | Admitting: Ophthalmology

## 2023-01-11 DIAGNOSIS — I10 Essential (primary) hypertension: Secondary | ICD-10-CM | POA: Diagnosis not present

## 2023-01-11 DIAGNOSIS — Z961 Presence of intraocular lens: Secondary | ICD-10-CM

## 2023-01-11 DIAGNOSIS — D3132 Benign neoplasm of left choroid: Secondary | ICD-10-CM | POA: Diagnosis not present

## 2023-01-11 DIAGNOSIS — H35033 Hypertensive retinopathy, bilateral: Secondary | ICD-10-CM | POA: Diagnosis not present

## 2023-01-11 DIAGNOSIS — H26492 Other secondary cataract, left eye: Secondary | ICD-10-CM | POA: Diagnosis not present

## 2023-01-12 ENCOUNTER — Encounter (INDEPENDENT_AMBULATORY_CARE_PROVIDER_SITE_OTHER): Payer: Self-pay | Admitting: Ophthalmology

## 2023-05-30 DIAGNOSIS — Z1331 Encounter for screening for depression: Secondary | ICD-10-CM | POA: Diagnosis not present

## 2023-05-30 DIAGNOSIS — Z0001 Encounter for general adult medical examination with abnormal findings: Secondary | ICD-10-CM | POA: Diagnosis not present

## 2023-05-30 DIAGNOSIS — Z6820 Body mass index (BMI) 20.0-20.9, adult: Secondary | ICD-10-CM | POA: Diagnosis not present

## 2023-05-30 DIAGNOSIS — R32 Unspecified urinary incontinence: Secondary | ICD-10-CM | POA: Diagnosis not present

## 2023-05-30 DIAGNOSIS — E7849 Other hyperlipidemia: Secondary | ICD-10-CM | POA: Diagnosis not present

## 2023-05-30 DIAGNOSIS — E782 Mixed hyperlipidemia: Secondary | ICD-10-CM | POA: Diagnosis not present

## 2023-05-30 DIAGNOSIS — I10 Essential (primary) hypertension: Secondary | ICD-10-CM | POA: Diagnosis not present

## 2023-06-05 IMAGING — MG DIGITAL DIAGNOSTIC BILAT W/ TOMO W/ CAD
8 of 14 series · 8 of 40 positions shown · non-contrast
Comparison: Previous exam(s).

CLINICAL DATA: Patient returns for short interval follow-up of
probably benign asymmetry in the RIGHT breast and probably benign
mass in the LEFT breast. Patient was recalled after new baseline
exam in December 2020.

EXAM:
DIGITAL DIAGNOSTIC BILATERAL MAMMOGRAM WITH TOMOSYNTHESIS AND CAD;
ULTRASOUND LEFT BREAST LIMITED
TECHNIQUE: Bilateral digital diagnostic mammography and breast tomosynthesis
was performed. The images were evaluated with computer-aided
detection.; Targeted ultrasound examination of the left breast was
performed.

[R MLO synth-2D]
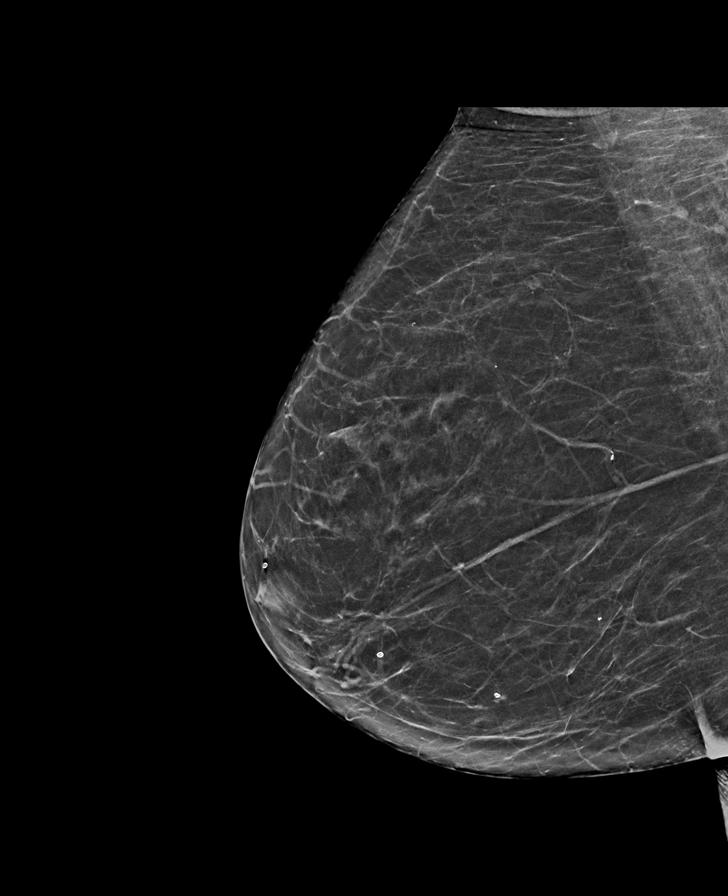

[L CC synth-2D (1 of 2)]
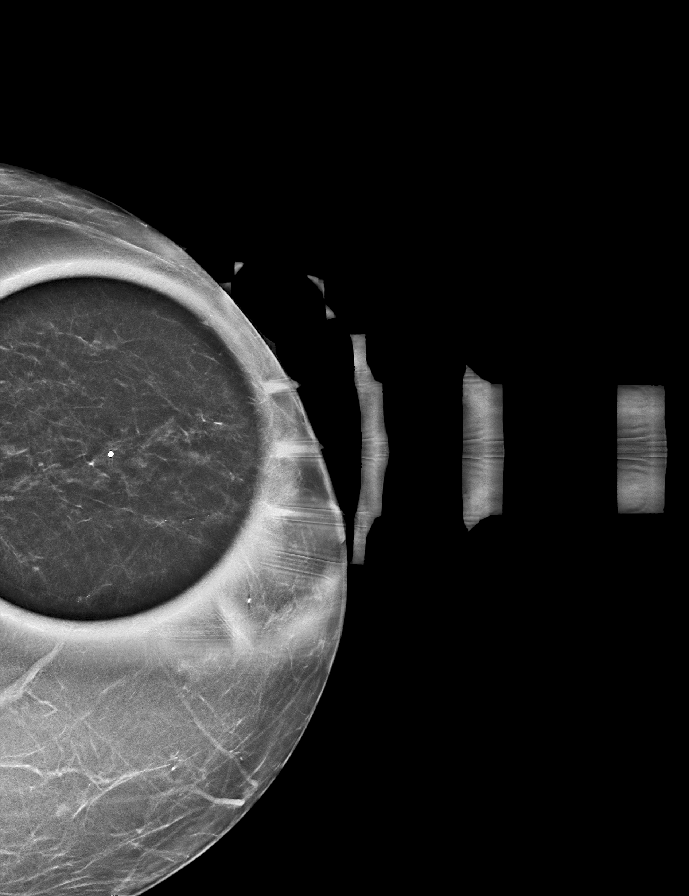

[L MLO synth-2D (1 of 3)]
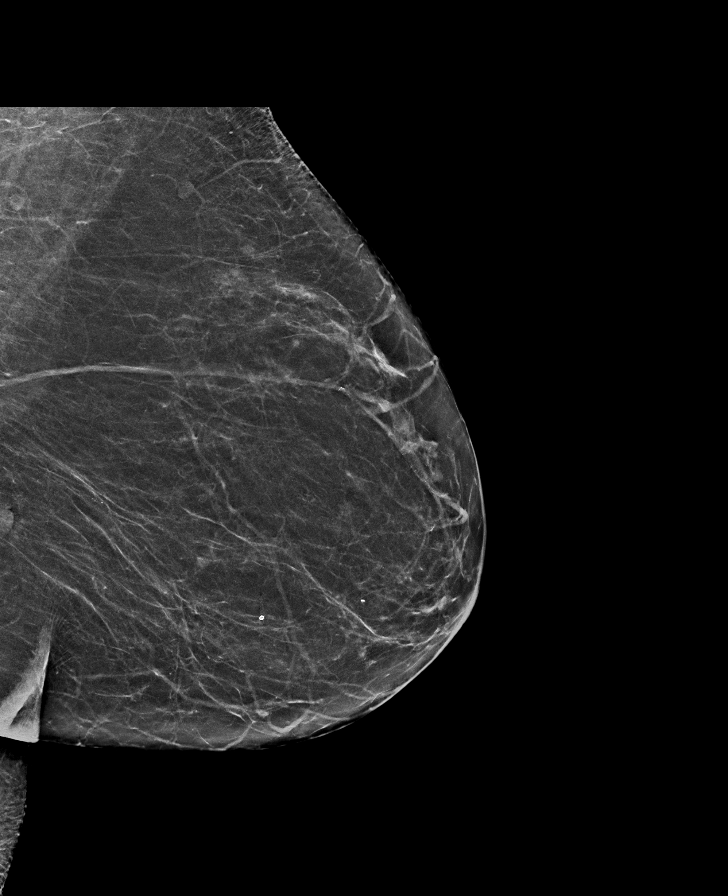

[R CC synth-2D]
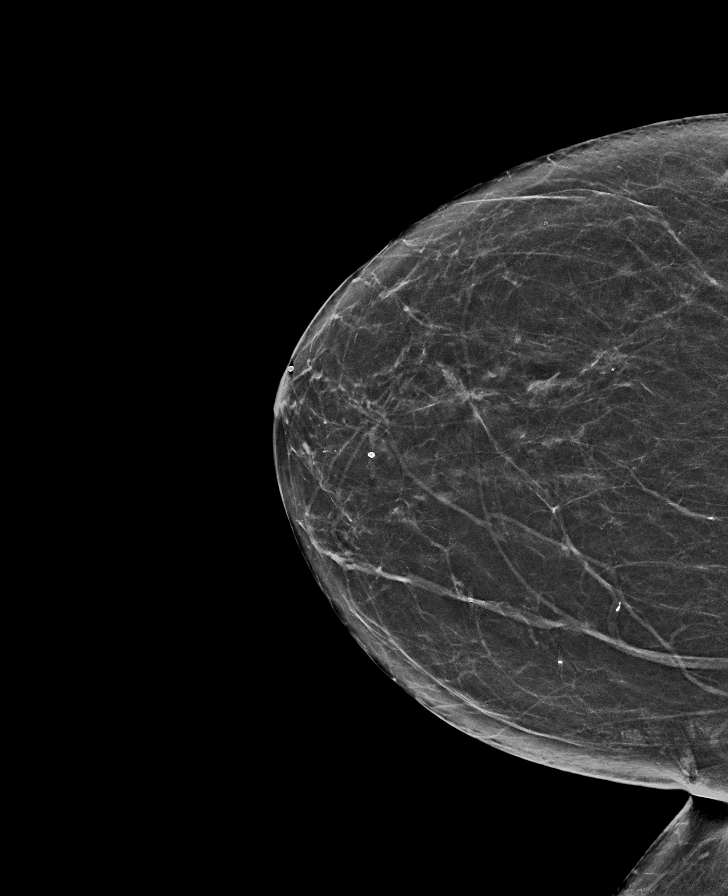

[L MLO synth-2D (2 of 3)]
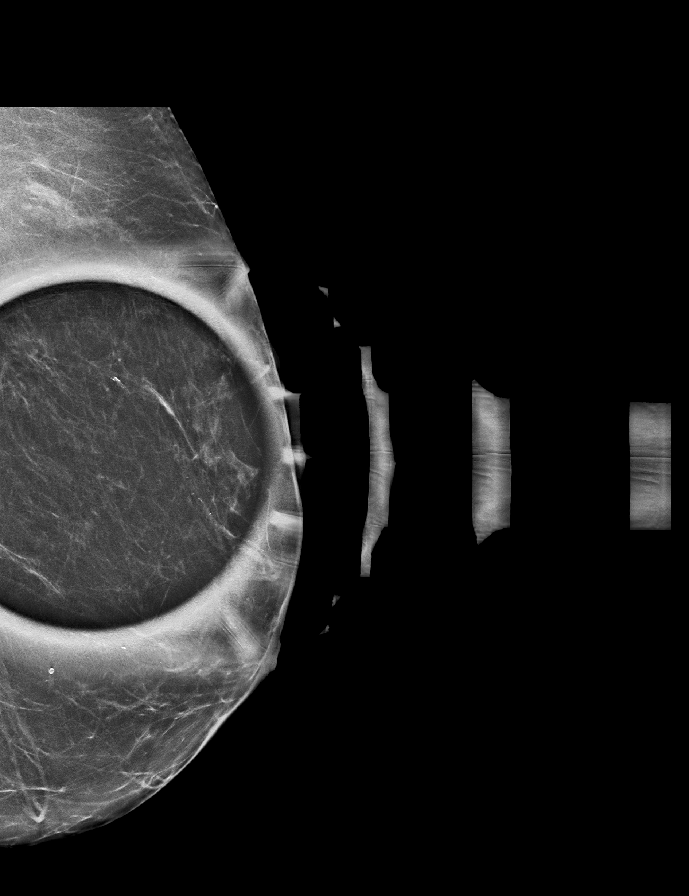

[L CC synth-2D (2 of 2)]
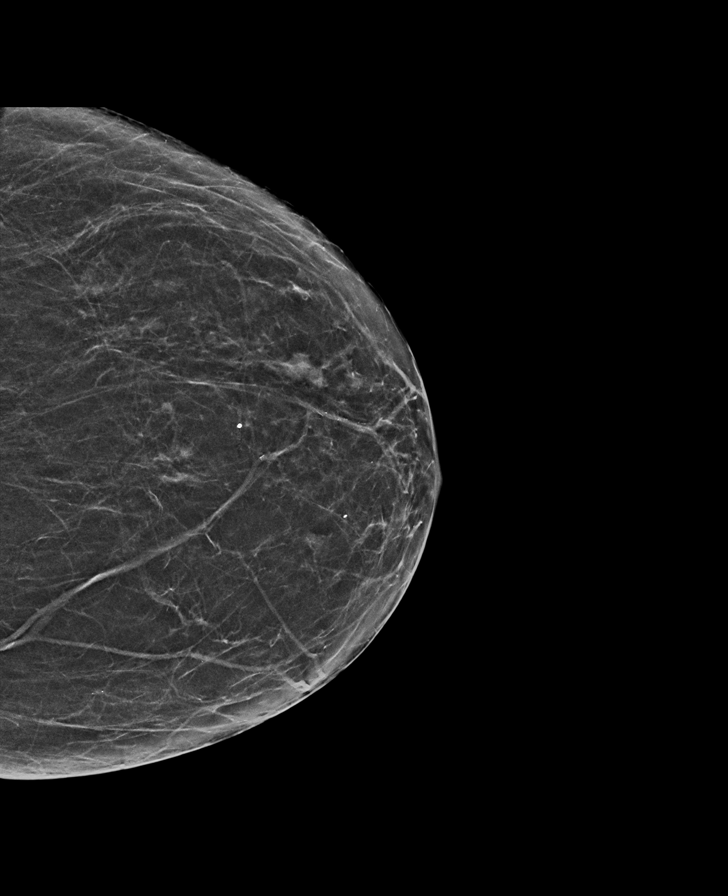

[L MLO synth-2D (3 of 3)]
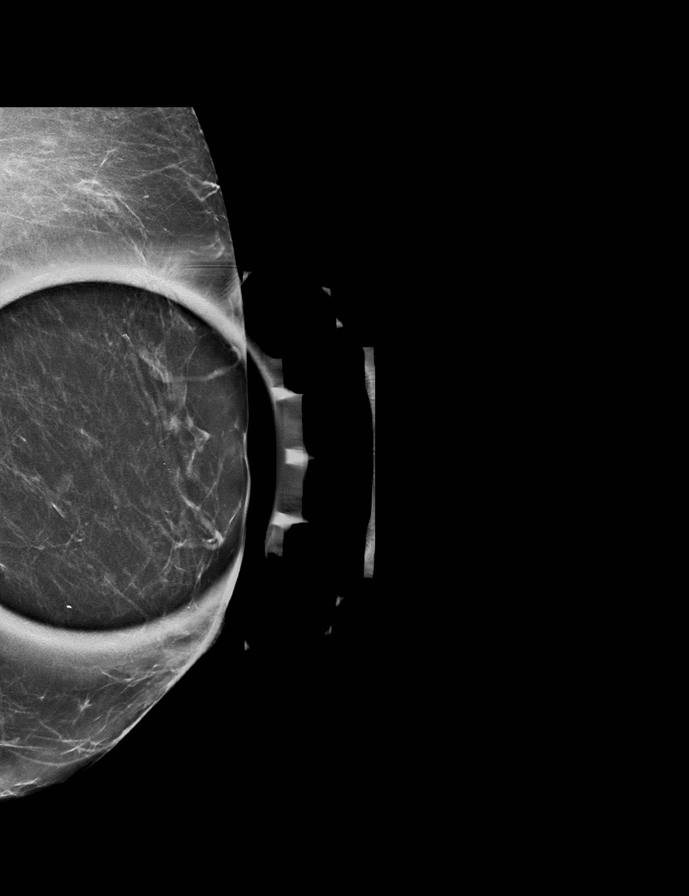

[L MLO tomo · tomo slice 28/55.0]
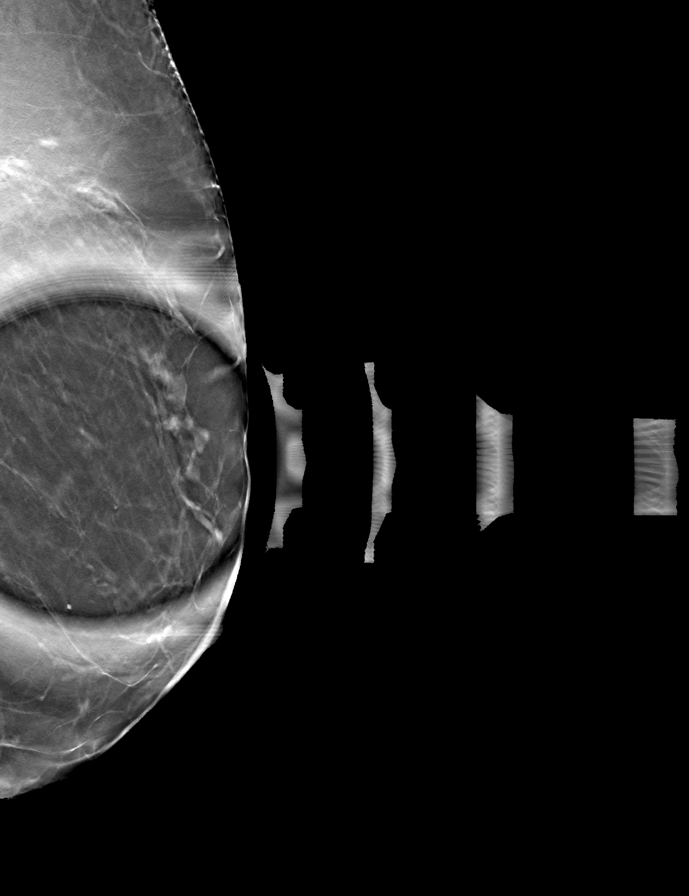

[8 of 40 positions shown; findings below may reference images not displayed]

ACR Breast Density Category b: There are scattered areas of
fibroglandular density.
FINDINGS: RIGHT BREAST:

Mammogram: Stable appearance of asymmetry in the UPPER OUTER
QUADRANT of the RIGHT breast. No new findings in the RIGHT breast.
Mammographic images were processed with CAD.

LEFT BREAST:

Mammogram: Small oval circumscribed mass in the LOWER central LEFT
breast is smaller compared with previous exam. Asymmetry in the
UPPER-OUTER QUADRANT of the LEFT breast is confirmed on spot
compression views and further evaluated with ultrasound.
Mammographic images were processed with CAD.

Ultrasound: Targeted ultrasound is performed, showing scattered
fibrocystic changes in the UPPER-OUTER QUADRANT of the LEFT breast,
without suspicious mass or acoustic shadowing.
IMPRESSION: 1. Stable asymmetry in the UPPER-OUTER QUADRANT of the RIGHT breast.
2. Smaller circumscribed oval mass in the LOWER central LEFT breast.
3. Fibrocystic changes in the UPPER-OUTER QUADRANT of the LEFT
breast.

RECOMMENDATION:
Recommend bilateral diagnostic mammogram in 6 months.

I have discussed the findings and recommendations with the patient.
If applicable, a reminder letter will be sent to the patient
regarding the next appointment.

BI-RADS CATEGORY  3: Probably benign.

## 2023-12-26 NOTE — Progress Notes (Signed)
 [facility] - Clinic Note  01/09/2024     CHIEF COMPLAINT Patient presents for Retina Follow Up   HISTORY OF PRESENT ILLNESS: Chelsea Harris is a 78 y.o. female who presents to the clinic today for:   HPI     Retina Follow Up   In left eye.  This started 1 year ago.  Duration of 1 year.  Since onset it is stable.  I, the attending physician,  performed the HPI with the patient and updated documentation appropriately.        Comments   1 year retina follow up nevus os pt is reporting vision is more blurred due to PCO she has not seen Dr Lasandra Points to have Yag she denies any flashes or floaters       Last edited by Ronelle Coffee, MD on 01/09/2024 12:39 PM.    Patient feels the film over her vision is making the vision worse.  Referring physician: Patria Bookbinder, OD [address] [place],  [place] [address]  HISTORICAL INFORMATION:   Selected notes from the MEDICAL RECORD NUMBER Referred by Dr. Patria Bookbinder for concern of nevus LEE:  Ocular Hx- PMH-    CURRENT MEDICATIONS: No current outpatient medications on file. (Ophthalmic Drugs)   No current facility-administered medications for this visit. (Ophthalmic Drugs)   Current Outpatient Medications (Other)  Medication Sig   acetaminophen  (TYLENOL ) 325 MG tablet Take 2 tablets (650 mg total) by mouth every 6 (six) hours as needed. Take with food   amLODipine (NORVASC) 10 MG tablet Take 10 mg by mouth daily.   levothyroxine (SYNTHROID) 50 MCG tablet Take 50 mcg by mouth daily before breakfast.   No current facility-administered medications for this visit. (Other)   REVIEW OF SYSTEMS: ROS   Positive for: Endocrine, Eyes Negative for: Constitutional, Gastrointestinal, Neurological, Skin, Genitourinary, Musculoskeletal, HENT, Cardiovascular, Respiratory, Psychiatric, Allergic/Imm, Heme/Lymph Last edited by Alise Appl, COT on 01/09/2024  9:05 AM.     ALLERGIES No Known  Allergies  PAST MEDICAL HISTORY Past Medical History:  Diagnosis Date   Bell's palsy    Hypertension    Skin cancer    Thyroid  disease    History reviewed. No pertinent surgical history.  FAMILY HISTORY Family History  Problem Relation Age of Onset   Healthy Mother    Healthy Father    SOCIAL HISTORY Social History   Tobacco Use   Smoking status: Never   Smokeless tobacco: Never  Vaping Use   Vaping status: Never Used  Substance Use Topics   Alcohol use: Never       OPHTHALMIC EXAM:  Base Eye Exam     Visual Acuity (Snellen - Linear)       Right Left   Dist [place] 20/40 20/30   Dist ph [place] 20/30 [place]         Tonometry (Tonopen, 9:12 AM)       Right Left   Pressure 11 10         Pupils       Pupils Dark Light Shape React APD   Right PERRL 3 2 Round Brisk None   Left PERRL 3 2 Round Brisk None         Visual Fields       Left Right    Full Full         Extraocular Movement       Right Left    Full, Ortho Full, Ortho  Neuro/Psych     Oriented x3: Yes   Mood/Affect: Normal         Dilation     Both eyes: 2.5% Phenylephrine @ 9:12 AM           Slit Lamp and Fundus Exam     Slit Lamp Exam       Right Left   Lids/Lashes Dermatochalasis - upper lid, mild MGD, Meibomian gland dysfunction Dermatochalasis - upper lid, mild MGD, Meibomian gland dysfunction   Conjunctiva/Sclera temporal pinguecula temporal pinguecula   Cornea 3+ fine Punctate epithelial erosions, well healed cataract wounds, mild arcus 1-2+ inferior Punctate epithelial erosions, well healed cataract wounds, mild arcus, +EBMD   Anterior Chamber deep and clear deep and clear   Iris Round and dilated Round and dilated   Lens PC IOL in good position PC IOL in good position, 1-2+ Posterior capsular opacification   Anterior Vitreous Vitreous syneresis Vitreous syneresis         Fundus Exam       Right Left   Disc Pink and Sharp, mild tilt, temporal PPA Pink  and Sharp, mild tilt, mild temporal PPA, Compact   C/D Ratio 0.4 0.4   Macula Flat, Blunted foveal reflex, fine Drusen temporal macula, Pigment clumping, No heme or edema Flat, Blunted foveal reflex, fine Drusen temporal macula, Pigment clumping, No heme or edema   Vessels attenuated, Tortuous attenuated, Tortuous   Periphery Attached, no heme Attached, pigmented choroidal nevus at 0100 with overlying drusen, no orange pigment or SRF, approx 3x4-5DD -- less than 2mm in thickness -- stable from prior, No heme           IMAGING AND PROCEDURES  Imaging and Procedures for 01/09/2024  OCT, Retina - OU - Both Eyes       Right Eye Quality was good. Central Foveal Thickness: 262. Progression has been stable. Findings include normal foveal contour, no IRF, no SRF.   Left Eye Quality was good. Central Foveal Thickness: 262. Progression has been stable. Findings include normal foveal contour, no IRF, no SRF (Focal elevated hyper reflective choroidal lesion superior periphery caught on widefield ).   Notes *Images captured and stored on drive  Diagnosis / Impression:  NFP, no IRF/SRF OU OS: Focal elevated hyper reflective choroidal lesion superior periphery caught on widefield  Clinical management:  See below  Abbreviations: NFP - Normal foveal profile. CME - cystoid macular edema. PED - pigment epithelial detachment. IRF - intraretinal fluid. SRF - subretinal fluid. EZ - ellipsoid zone. ERM - epiretinal membrane. ORA - outer retinal atrophy. ORT - outer retinal tubulation. SRHM - subretinal hyper-reflective material. IRHM - intraretinal hyper-reflective material      Color Fundus Photography Optos - OU - Both Eyes       Right Eye Progression has been stable. Disc findings include (tilted). Macula : drusen, flat. Vessels : tortuous vessels. Periphery : RPE abnormality.   Left Eye Progression has been stable. Disc findings include normal observations. Macula : drusen. Vessels :  tortuous vessels (Focal dilation on ST venule). Periphery : RPE abnormality (Focal choroidal nevus at 0130 -- +drusen, no SRF or orange pigment).   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 Essential hypertension  I10     3. Hypertensive retinopathy of both eyes  H35.033     4. Pseudophakia, both eyes  Z96.1     5. 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 Bilateral dry eyes  H04.123      1. Choroidal Nevus OS -- stable  - mildly elevated pigmented lesion at 0100  - no visual symptoms, SRF or orange pigment  - +drusen  - thickness < 2mm - OCT and Optos color images obtained (01.14.21) to establish baseline -- unchanged on today's repeat images and exam  - discussed findings, prognosis  - continue monitoring  - f/u 1 yr, sooner prn -- DFE/OCT, Optos color images  2,3. Hypertensive retinopathy OU - discussed importance of tight BP control - monitor  4. Pseudophakia OU  - s/p CE/IOL OU (Fall/Winter 2021 w/ Dr. Lasandra Points)  - IOLs in good position, doing well - monitor  5. PCO OS - patient would like to wait on Yag Cap OS  6. Dry eyes OU - recommend artificial tears and lubricating ointment as needed   Ophthalmic Meds Ordered this visit:  No orders of the defined types were placed in this encounter.    Return in about 1 year (around 01/08/2025) for f/u Nevus OS, DFE, OCT, Optos color photos.  There are no Patient Instructions on file for this visit.  This document serves as a record of services personally performed by Jeanice Millard, MD, PhD. It was created on their behalf by Ronelle Coffee, MD, an ophthalmic technician. The creation of this record is the provider's dictation and/or activities during  the visit.    Electronically signed by: Ronelle Coffee, MD 01/21/22 12:42 PM   Jeanice Millard, M.D., Ph.D. Diseases & Surgery of the Retina and Vitreous [facility]  I have reviewed the above documentation for accuracy and completeness, and I agree with the above. Jeanice Millard, M.D., Ph.D. 01/09/24 12:43 PM   Abbreviations: M myopia (nearsighted); A astigmatism; H hyperopia (farsighted); P presbyopia; Mrx spectacle prescription;  CTL contact lenses; OD right eye; OS left eye; OU both eyes  XT exotropia; ET esotropia; PEK punctate epithelial keratitis; PEE punctate epithelial erosions; DES dry eye syndrome; MGD meibomian gland dysfunction; ATs artificial tears; PFAT's preservative free artificial tears; NSC nuclear sclerotic cataract; PSC posterior subcapsular cataract; ERM epi-retinal membrane; PVD posterior vitreous detachment; RD retinal detachment; DM diabetes mellitus; DR diabetic retinopathy; NPDR non-proliferative diabetic retinopathy; PDR proliferative diabetic retinopathy; CSME clinically significant macular edema; DME diabetic macular edema; dbh dot blot hemorrhages; CWS cotton wool spot; POAG primary open angle glaucoma; C/D cup-to-disc ratio; HVF humphrey visual field; GVF goldmann visual field; OCT optical coherence tomography; IOP intraocular pressure; BRVO Branch retinal vein occlusion; CRVO central retinal vein occlusion; CRAO central retinal artery occlusion; BRAO branch retinal artery occlusion; RT retinal tear; SB scleral buckle; PPV pars plana vitrectomy; VH Vitreous hemorrhage; PRP panretinal laser photocoagulation; IVK intravitreal kenalog; VMT vitreomacular traction; MH Macular hole;  NVD neovascularization of the disc; NVE neovascularization elsewhere; AREDS age related eye disease study; ARMD age related macular degeneration; POAG primary open angle glaucoma; EBMD epithelial/anterior basement membrane dystrophy; ACIOL anterior chamber intraocular lens; IOL  intraocular lens; PCIOL posterior chamber intraocular lens; Phaco/IOL phacoemulsification with intraocular lens placement; PRK photorefractive keratectomy; LASIK laser assisted in situ keratomileusis; HTN hypertension; DM diabetes mellitus; COPD chronic obstructive pulmonary disease

## 2024-01-09 ENCOUNTER — Ambulatory Visit (INDEPENDENT_AMBULATORY_CARE_PROVIDER_SITE_OTHER): Payer: Medicare Other | Admitting: Ophthalmology

## 2024-01-09 ENCOUNTER — Encounter (INDEPENDENT_AMBULATORY_CARE_PROVIDER_SITE_OTHER): Payer: Self-pay | Admitting: Ophthalmology

## 2024-01-09 DIAGNOSIS — I10 Essential (primary) hypertension: Secondary | ICD-10-CM | POA: Diagnosis not present

## 2024-01-09 DIAGNOSIS — H26492 Other secondary cataract, left eye: Secondary | ICD-10-CM

## 2024-01-09 DIAGNOSIS — Z961 Presence of intraocular lens: Secondary | ICD-10-CM | POA: Diagnosis not present

## 2024-01-09 DIAGNOSIS — H04123 Dry eye syndrome of bilateral lacrimal glands: Secondary | ICD-10-CM

## 2024-01-09 DIAGNOSIS — H35033 Hypertensive retinopathy, bilateral: Secondary | ICD-10-CM | POA: Diagnosis not present

## 2024-01-09 DIAGNOSIS — D3132 Benign neoplasm of left choroid: Secondary | ICD-10-CM | POA: Diagnosis not present

## 2024-02-15 DIAGNOSIS — C44319 Basal cell carcinoma of skin of other parts of face: Secondary | ICD-10-CM | POA: Diagnosis not present

## 2024-03-28 DIAGNOSIS — Z08 Encounter for follow-up examination after completed treatment for malignant neoplasm: Secondary | ICD-10-CM | POA: Diagnosis not present

## 2024-03-28 DIAGNOSIS — Z85828 Personal history of other malignant neoplasm of skin: Secondary | ICD-10-CM | POA: Diagnosis not present

## 2024-08-15 DIAGNOSIS — X32XXXD Exposure to sunlight, subsequent encounter: Secondary | ICD-10-CM | POA: Diagnosis not present

## 2024-08-15 DIAGNOSIS — Z85828 Personal history of other malignant neoplasm of skin: Secondary | ICD-10-CM | POA: Diagnosis not present

## 2024-08-15 DIAGNOSIS — Z08 Encounter for follow-up examination after completed treatment for malignant neoplasm: Secondary | ICD-10-CM | POA: Diagnosis not present

## 2024-08-15 DIAGNOSIS — L570 Actinic keratosis: Secondary | ICD-10-CM | POA: Diagnosis not present

## 2024-08-27 DIAGNOSIS — E785 Hyperlipidemia, unspecified: Secondary | ICD-10-CM | POA: Diagnosis not present

## 2024-08-27 DIAGNOSIS — E039 Hypothyroidism, unspecified: Secondary | ICD-10-CM | POA: Diagnosis not present

## 2025-01-07 ENCOUNTER — Encounter (INDEPENDENT_AMBULATORY_CARE_PROVIDER_SITE_OTHER): Admitting: Ophthalmology
